# Patient Record
Sex: Female | Born: 1980 | Race: Black or African American | Hispanic: No | Marital: Single | Smoking: Never smoker
Health system: Southern US, Community
[De-identification: ages and names within clinical notes are randomized; demographics above are authoritative.]

## PROBLEM LIST (undated history)

## (undated) DIAGNOSIS — J302 Other seasonal allergic rhinitis: Secondary | ICD-10-CM

## (undated) DIAGNOSIS — G4730 Sleep apnea, unspecified: Secondary | ICD-10-CM

## (undated) DIAGNOSIS — N926 Irregular menstruation, unspecified: Secondary | ICD-10-CM

## (undated) DIAGNOSIS — R519 Headache, unspecified: Secondary | ICD-10-CM

## (undated) DIAGNOSIS — M1990 Unspecified osteoarthritis, unspecified site: Secondary | ICD-10-CM

## (undated) DIAGNOSIS — J45909 Unspecified asthma, uncomplicated: Secondary | ICD-10-CM

## (undated) DIAGNOSIS — G5600 Carpal tunnel syndrome, unspecified upper limb: Secondary | ICD-10-CM

## (undated) DIAGNOSIS — F419 Anxiety disorder, unspecified: Secondary | ICD-10-CM

## (undated) DIAGNOSIS — R51 Headache: Secondary | ICD-10-CM

## (undated) HISTORY — PX: WISDOM TOOTH EXTRACTION: SHX21

## (undated) HISTORY — DX: Headache, unspecified: R51.9

## (undated) HISTORY — DX: Headache: R51

---
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
## 2002-05-12 ENCOUNTER — Other Ambulatory Visit: Admission: RE | Admit: 2002-05-12 | Discharge: 2002-05-12 | Payer: Self-pay | Admitting: Gynecology

## 2003-05-24 ENCOUNTER — Other Ambulatory Visit: Admission: RE | Admit: 2003-05-24 | Discharge: 2003-05-24 | Payer: Self-pay | Admitting: Gynecology

## 2003-12-12 ENCOUNTER — Inpatient Hospital Stay (HOSPITAL_COMMUNITY): Admission: AD | Admit: 2003-12-12 | Discharge: 2003-12-15 | Payer: Self-pay | Admitting: *Deleted

## 2003-12-12 ENCOUNTER — Encounter (INDEPENDENT_AMBULATORY_CARE_PROVIDER_SITE_OTHER): Payer: Self-pay | Admitting: Specialist

## 2004-03-08 ENCOUNTER — Other Ambulatory Visit: Admission: RE | Admit: 2004-03-08 | Discharge: 2004-03-08 | Payer: Self-pay | Admitting: Gynecology

## 2004-10-01 ENCOUNTER — Emergency Department (HOSPITAL_COMMUNITY): Admission: EM | Admit: 2004-10-01 | Discharge: 2004-10-01 | Payer: Self-pay | Admitting: Family Medicine

## 2006-03-11 ENCOUNTER — Inpatient Hospital Stay (HOSPITAL_COMMUNITY): Admission: AD | Admit: 2006-03-11 | Discharge: 2006-03-11 | Payer: Self-pay | Admitting: Gynecology

## 2006-03-18 ENCOUNTER — Encounter (INDEPENDENT_AMBULATORY_CARE_PROVIDER_SITE_OTHER): Payer: Self-pay | Admitting: *Deleted

## 2006-03-18 ENCOUNTER — Ambulatory Visit: Payer: Self-pay | Admitting: Obstetrics & Gynecology

## 2006-03-25 ENCOUNTER — Ambulatory Visit: Payer: Self-pay | Admitting: Obstetrics & Gynecology

## 2006-04-01 ENCOUNTER — Ambulatory Visit: Payer: Self-pay | Admitting: Obstetrics & Gynecology

## 2006-04-08 ENCOUNTER — Ambulatory Visit: Payer: Self-pay | Admitting: Obstetrics & Gynecology

## 2006-04-15 ENCOUNTER — Ambulatory Visit: Payer: Self-pay | Admitting: Obstetrics & Gynecology

## 2006-04-22 ENCOUNTER — Ambulatory Visit: Payer: Self-pay | Admitting: Obstetrics & Gynecology

## 2006-04-29 ENCOUNTER — Ambulatory Visit: Payer: Self-pay | Admitting: *Deleted

## 2006-05-06 ENCOUNTER — Ambulatory Visit: Payer: Self-pay | Admitting: Obstetrics & Gynecology

## 2006-05-07 ENCOUNTER — Ambulatory Visit: Payer: Self-pay | Admitting: Family Medicine

## 2006-05-08 ENCOUNTER — Inpatient Hospital Stay (HOSPITAL_COMMUNITY): Admission: AD | Admit: 2006-05-08 | Discharge: 2006-05-10 | Payer: Self-pay | Admitting: Obstetrics & Gynecology

## 2006-05-08 ENCOUNTER — Ambulatory Visit: Payer: Self-pay | Admitting: Gynecology

## 2006-05-10 HISTORY — PX: TUBAL LIGATION: SHX77

## 2008-04-11 ENCOUNTER — Emergency Department (HOSPITAL_COMMUNITY): Admission: EM | Admit: 2008-04-11 | Discharge: 2008-04-11 | Payer: Self-pay | Admitting: Emergency Medicine

## 2008-04-13 ENCOUNTER — Inpatient Hospital Stay (HOSPITAL_COMMUNITY): Admission: AD | Admit: 2008-04-13 | Discharge: 2008-04-13 | Payer: Self-pay | Admitting: Obstetrics & Gynecology

## 2008-06-05 ENCOUNTER — Ambulatory Visit (HOSPITAL_COMMUNITY): Admission: RE | Admit: 2008-06-05 | Discharge: 2008-06-05 | Payer: Self-pay | Admitting: Obstetrics and Gynecology

## 2008-06-07 ENCOUNTER — Ambulatory Visit: Payer: Self-pay | Admitting: Obstetrics & Gynecology

## 2008-06-08 ENCOUNTER — Encounter: Payer: Self-pay | Admitting: Obstetrics & Gynecology

## 2008-06-08 LAB — CONVERTED CEMR LAB
Chlamydia, DNA Probe: NEGATIVE
TSH: 0.264 microintl units/mL — ABNORMAL LOW (ref 0.350–4.50)

## 2008-06-27 ENCOUNTER — Ambulatory Visit: Payer: Self-pay | Admitting: Obstetrics & Gynecology

## 2008-06-27 ENCOUNTER — Encounter: Payer: Self-pay | Admitting: Obstetrics & Gynecology

## 2008-06-27 LAB — CONVERTED CEMR LAB: T3 Uptake Ratio: 24.1 % (ref 22.5–37.0)

## 2008-06-29 ENCOUNTER — Ambulatory Visit: Payer: Self-pay | Admitting: Obstetrics & Gynecology

## 2008-06-29 ENCOUNTER — Encounter: Payer: Self-pay | Admitting: Obstetrics & Gynecology

## 2008-06-29 LAB — CONVERTED CEMR LAB
Chlamydia, DNA Probe: NEGATIVE
GC Probe Amp, Genital: NEGATIVE

## 2008-06-30 ENCOUNTER — Encounter: Payer: Self-pay | Admitting: Obstetrics & Gynecology

## 2008-06-30 LAB — CONVERTED CEMR LAB: Trich, Wet Prep: NONE SEEN

## 2009-06-28 ENCOUNTER — Emergency Department (HOSPITAL_COMMUNITY): Admission: EM | Admit: 2009-06-28 | Discharge: 2009-06-28 | Payer: Self-pay | Admitting: Family Medicine

## 2009-06-30 ENCOUNTER — Emergency Department (HOSPITAL_COMMUNITY): Admission: EM | Admit: 2009-06-30 | Discharge: 2009-06-30 | Payer: Self-pay | Admitting: Family Medicine

## 2009-11-14 ENCOUNTER — Ambulatory Visit: Payer: Self-pay | Admitting: Obstetrics and Gynecology

## 2009-11-14 LAB — CONVERTED CEMR LAB: Pap Smear: NEGATIVE

## 2010-02-20 ENCOUNTER — Ambulatory Visit: Payer: Self-pay | Admitting: Obstetrics and Gynecology

## 2010-02-21 ENCOUNTER — Encounter: Payer: Self-pay | Admitting: Obstetrics & Gynecology

## 2010-02-21 LAB — CONVERTED CEMR LAB
MCHC: 33.7 g/dL (ref 30.0–36.0)
Platelets: 390 10*3/uL (ref 150–400)
RDW: 14.1 % (ref 11.5–15.5)
TSH: 0.564 microintl units/mL (ref 0.350–4.500)

## 2010-07-14 ENCOUNTER — Encounter: Payer: Self-pay | Admitting: *Deleted

## 2010-11-05 NOTE — Group Therapy Note (Signed)
Tara Branch, Tara Branch              ACCOUNT NO.:  [id number]   MEDICAL RECORD NO.:  [id number]          PATIENT TYPE:  WOC   LOCATION:  [facility]                   FACILITY:  [facility]   PHYSICIAN:  Allie Bossier, MD        DATE OF BIRTH:  [birth year]   DATE OF SERVICE:  06/07/2008                                  CLINIC NOTE   CHIEF COMPLAINT:  Menorrhagia.   HISTORY OF PRESENT ILLNESS:  Tara Branch is a 30 year old G4, P2-0-2-2, who  presents for two episodes or irregular bleeding.  The patient is not  currently not using any oral hormonal contraceptive method.  She had her  tubes tied in 2007 after her last pregnancy.  She had regular periods  her entire life up until September of 2009.  She had periods every 28  days that were 5-6 days long with moderate blood flow and no cramping;  however, at the beginning of September 2009 she began to bleed heavily,  passing clots and feeling dizzy upon standing.  She continued to bleed  throughout September into October and she did present to the maternity  admissions unit at [facility] on April 13, 2008 for this  bleeding.  At that time a pelvic ultrasound was performed that showed a  normal endometrial stripe thickness and appearance.  No evidence of  fibroids.  A normal sized uterus measuring approximately 8 cm in length.  Endometrial stripe was 7 mm in maximum thickness.  Both ovaries were  normal in appearance with no masses or free fluid identified.  A CBC was  also obtained and hemoglobin was found to be 11.6.  The patient was  given a 5-day course of Provera and she reports that she stopped  bleeding on the fourth day of her Provera.  She did not bleed in  November.  Her last period started on May 24, 2008 and it lasted  nine days which is unusual for her.  She denies any pelvic pain, any  vaginal discharge.  She reports that she is not sexually active.   PHYSICAL EXAMINATION:  VITAL SIGNS:  Temperature is 97.6, pulse is 97,  blood  pressure 116/73, respiratory rate 20, weight is 214.1 pounds,  height is 5 feet 5 inches.  GENERAL:  The patient is a pleasant, obese female in no acute distress.  PELVIC:  The patient has normal external genitalia, normal vaginal  mucosa.  There is a yellow, thin, vaginal discharge present.  Cervix is  normal without any lesions and nontendcr.  Uterus is of normal size and  nontender.  Adnexa are nontender without any masses.   ASSESSMENT/PLAN:  1. This is a 30 year old female with dysfunctional uterine bleeding.  2. Vaginal discharge.  Cultures were collected for gonorrhea and      Chlamydia.  The patient was informed that she will be informed of      any abnormal results.\  3. Screening for cervical cancer.  The patient was due for a Pap smear      as her last Pap smear was in 2007 and this was performed and she  was notified that she would be informed of the results.   PLAN:  We will check a TSH and start a monophasic birth control pill.  A  prescription was written for Lo-Ovral.  She was instructed to come back  in three weeks to ensure that there are no adverse reactions to the  birth control pill including hypertension.      Allie Bossier, MD     MCD/MEDQ  D:  06/07/2008  T:  06/07/2008  Job:  [id number]

## 2010-11-05 NOTE — Group Therapy Note (Signed)
NAMEMCKENZIE, BOVE              ACCOUNT NO.:  [id number]   MEDICAL RECORD NO.:  [id number]          PATIENT TYPE:  WOC   LOCATION:  [facility]                   FACILITY:  [facility]   PHYSICIAN:  Johnella Moloney, MD        DATE OF BIRTH:  [birth year]   DATE OF SERVICE:  06/29/2008                                  CLINIC NOTE   CHIEF COMPLAINT:  Pain with intercourse.   HISTORY OF PRESENT ILLNESS:  The patient is a 30 year old gravida 4,  para 2-0-2-2 who was last seen for her annual examination in June 07, 2008.  The patient reports that 2 weeks ago, she had one episode of  pain during intercourse and also noted a small amount of spotting.  She  is also noted some abnormal discharge, but this went away after few  days.  The patient reports no other symptoms.   PHYSICAL EXAMINATION:  VITAL SIGNS:  The patient was afebrile.  Other  vital signs were stable.  ABDOMEN:  Soft, nontender, and nondistended.  PELVIC:  The patient had a normal external female genitalia.  On  speculum examination, the patient was noted to have a retained tampon.  The patient reports that her last menstrual period was over a month ago  and she did not realize that the tampon was still in place.  The tampon  was grasped with ring forceps and removed in its entirety.  There was  some vaginal irritation from the upper part of vagina where the tampon  was and abnormal discharge that was yellow in appearance and very  malodorous.  A sample of test was taken for wet prep and a gonorrhea and  Chlamydia were also done from her cervix.  On bimanual exam, the patient  had no cervical motion tenderness, uterine tenderness, or adnexal  tenderness.  No abnormal masses were palpated.   IMPRESSION:  The patient is a 27-year gravida 4, para 2 presenting with  dyspareunia and found to have a retained tampon on examination.  The  patient was told that a retained tampon that has led to some Trichomonas  vaginitis and possible  cervicitis, which could explain her dyspareunia.  She was informed about the dangers of retained tampon including toxic  shock syndrome and told to be more vigilant if she is going to continue  to use hot tampons for her menstrual periods.  Given the abnormal  discharge, she was given a prescription for Flagyl 500 mg p.o. b.i.d. as  the least that she would have given her to retain tablet will be  bacterial vaginosis.  We will followup the results of the wet prep and  GC Chlamydia to evaluate for further management.           ______________________________  Johnella Moloney, MD     UD/MEDQ  D:  06/29/2008  T:  06/30/2008  Job:  [id number]

## 2010-11-08 NOTE — Op Note (Signed)
Tara Branch, Tara Branch              ACCOUNT NO.:  [id number]   MEDICAL RECORD NO.:  [id number]          PATIENT TYPE:  WOC   LOCATION:  [facility]                          FACILITY:  [facility]   PHYSICIAN:  Phil D. Okey Dupre, M.D.     DATE OF BIRTH:  07/22/1980   DATE OF PROCEDURE:  05/10/2006  DATE OF DISCHARGE:                                 OPERATIVE REPORT   PREOPERATIVE DIAGNOSIS:  Multiparity, desires permanent sterilization.   POSTOPERATIVE DIAGNOSIS:  Multiparity, desires permanent sterilization.   PROCEDURE:  Postpartum bilateral tubal ligation with Filshie clips.   SURGEON:  Javier Glazier. Okey Dupre, M.D.   ASSISTANT:  Paticia Stack, MD.   ANESTHESIA:  General.   SPECIMENS:  None.   ESTIMATED BLOOD LOSS:  Minimal.   No complications.   INDICATIONS FOR PROCEDURE:  This is a 30 year old gravida 4, para 2-0-2-2,  status post spontaneous vaginal delivery who desires permanent  sterilization.  Risks and benefits of procedure discussed with the patient  including risk of failure and increased risk of ectopic gestation if  pregnancy occurs.   FINDINGS:  Normal uterus, tubes and ovaries.   PROCEDURE:  Patient was taken to the operating room where she was placed  under general anesthesia.  A small vertical infraumbilical skin incision was  made with a scalpel.  The incision was carried down through the underlying  fascia until the peritoneum was identified and entered.  The peritoneum was  noted to be free of any adhesions an the incision was then extended with the  Metzenbaum scissors.  The patient's left fallopian tube was then identified,  brought to the incision and grasped with the Babcock clamp.  The tube was  followed out to the fimbria.  Babcock clamps were used to isolate the tube  and Filshie clip was placed about 4 cm from the cornual region.  There was  no bleeding and the tube returned to the abdomen.  The right fallopian tube  was then grasped in a similar fashion with the Babcock  clamps and the  Filshie clips placed in a similar fashion.  No bleeding was noted and the  tube was  returned to the abdomen.  The peritoneum and fascia was then closed in a  single layer using #3-0 Vicryl.  The skin was closed in a subcuticular  fashion using #2-0 Vicryl on a Keith needle.  The patient tolerated the  procedure well.  Sponge, lap and needle counts were correct x2.  The patient  was taken to the recovery room in stable condition.     ______________________________  Paticia Stack, MD    ______________________________  Javier Glazier Okey Dupre, M.D.    LNJ/MEDQ  D:  05/10/2006  T:  05/10/2006  Job:  [id number]

## 2010-11-08 NOTE — Op Note (Signed)
[name], Tara Branch                          ACCOUNT NO.:  [id number]   MEDICAL RECORD NO.:  [id number]                   PATIENT TYPE:  INP   LOCATION:  9160                                 [facility]   PHYSICIAN:  Timothy P. Fontaine, M.D.           DATE OF BIRTH:  Nov 27, 1980   DATE OF PROCEDURE:  12/12/2003  DATE OF DISCHARGE:                                 OPERATIVE REPORT   PREOPERATIVE DIAGNOSES:  1. Pregnancy at term.  2. Nonreassuring fetal heart rate tracing, recurrent late decelerations.   POSTOPERATIVE DIAGNOSES:  1. Pregnancy at term.  2. Nonreassuring fetal heart rate tracing, recurrent late decelerations.   PROCEDURE:  Primary low transverse cervical cesarean section.   SURGEON:  Timothy P. Fontaine, M.D.   ASSISTANT:  Scrub technician.   ANESTHESIA:  Epidural with Marcaine 0.25% subcuticular injection.   ESTIMATED BLOOD LOSS:  Less than 500 mL.   COMPLICATIONS:  None.   SPECIMENS:  1. Samples of cord blood.  2. Placenta umbilical cord.   FINDINGS:  At 40 normal female infant, Apgar 8 & 9, weight 7 pounds 13  ounces, pelvic anatomy noted to be normal.   DESCRIPTION OF PROCEDURE:  The patient was taken to the operating room and  underwent dosing of her epidural catheter and was placed in left tilt supine  position, received an abdominal preparation with Betadine solution and was  draped in the usual fashion having had a Foley catheter previously placed in  Labor & Delivery.  Initial check for adequate anesthesia showed persistent  tenderness along the incision line and the incision line was subsequently  injected using 0.25% Marcaine in the subcutaneous space. Subsequently, the  abdomen was sharply entered through a primary Pfannenstiel incision  achieving adequate hemostasis at all levels. The bladder flap was sharply  and bluntly developed without difficulty and the uterus was sharply entered  in the lower uterine segment and bluntly extended  laterally.  The membranes  were ruptured. The fluid noted to be mildly green tinged, no particular  matter. The head was delivered, the nares and mouth suctioned, nuchal cord  x1 reduced, the rest of the infant delivered, the cord doubly clamped and  cut and the infant was handed to pediatrics in attendance. Samples of cord  blood were obtained, the placenta was then spontaneously extruded, noted to  be intact and was sent to pathology. The uterus was exteriorized and the  endometrial cavity was explored with a sponge to remove all placental and  membrane fragments.  The patient received 1 g Ancef antibiotic prophylaxis  at this time.  The uterine incision was closed in one layer using #0 Vicryl  suture in a running interlocking stitch. The uterus was returned to the  abdomen which was copiously irrigated and adequate hemostasis was  visualized. The anterior fascia was reapproximated using #0 Vicryl suture in  a running stitch. The subcutaneous tissue was irrigated, adequate hemostasis  was achieved with electrocautery, the skin reapproximated using 4-0 Vicryl  in a running subcuticular stitch, Steri-Strips and Benzoin applied, sterile  dressing applied. The patient was taken to the recovery room in good  condition having tolerated the procedure well.                                               Timothy P. Audie Box, M.D.    TPF/MEDQ  D:  12/12/2003  T:  12/13/2003  Job:  [id number]

## 2010-11-08 NOTE — Discharge Summary (Signed)
Tara Branch, Tara Branch                          ACCOUNT NO.:  [id number]   MEDICAL RECORD NO.:  [id number]                   PATIENT TYPE:  INP   LOCATION:  9102                                 [facility]   PHYSICIAN:  Timothy P. Fontaine, M.D.           DATE OF BIRTH:  1980/10/09   DATE OF ADMISSION:  12/12/2003  DATE OF DISCHARGE:  12/15/2003                                 DISCHARGE SUMMARY   DISCHARGE DIAGNOSES:  1. Pregnancy at term.  2. Nonreassuring fetal tracing.   PROCEDURE:  Primary low transverse cervical cesarean section on December 12, 2003.   HOSPITAL COURSE:  A 30 year old G3 P0 AB2 female at [redacted] weeks gestation  admitted in labor.  The patient progressed to 4 cm dilatation at which time  she had repetitive late decelerations and underwent a primary low transverse  cervical cesarean section for nonreassuring fetal tracing.  The patient  produced a normal female infant, Apgars 8 and 9, weight 7 pounds 13 ounces, at  1402.  The patient's postpartum course was uncomplicated.  She was  discharged on postpartum day #3 ambulating well, tolerating a regular diet,  with a hemoglobin of 10, a maternal blood type of O positive, and a rubella  titer positive.  The patient received precautions, instructions, and follow-  up, will be seen in the office in 6 weeks, and received a prescription for  Tylox #20 one to two p.o. q.4-6h. p.r.n. pain.                                               Timothy P. Audie Box, M.D.    TPF/MEDQ  D:  12/15/2003  T:  12/16/2003  Job:  [id number]

## 2011-01-20 ENCOUNTER — Encounter (HOSPITAL_COMMUNITY): Payer: Self-pay | Admitting: *Deleted

## 2011-01-20 ENCOUNTER — Inpatient Hospital Stay (HOSPITAL_COMMUNITY)
Admission: AD | Admit: 2011-01-20 | Discharge: 2011-01-20 | Disposition: A | Discharge status: Home / Self Care | Payer: Self-pay | Source: Ambulatory Visit | Attending: Obstetrics and Gynecology | Admitting: Obstetrics and Gynecology

## 2011-01-20 DIAGNOSIS — B373 Candidiasis of vulva and vagina: Secondary | ICD-10-CM

## 2011-01-20 DIAGNOSIS — B3731 Acute candidiasis of vulva and vagina: Secondary | ICD-10-CM | POA: Insufficient documentation

## 2011-01-20 DIAGNOSIS — N939 Abnormal uterine and vaginal bleeding, unspecified: Secondary | ICD-10-CM

## 2011-01-20 DIAGNOSIS — R109 Unspecified abdominal pain: Secondary | ICD-10-CM | POA: Insufficient documentation

## 2011-01-20 DIAGNOSIS — N898 Other specified noninflammatory disorders of vagina: Secondary | ICD-10-CM | POA: Insufficient documentation

## 2011-01-20 LAB — CBC
HCT: 35.7 % — ABNORMAL LOW (ref 36.0–46.0)
MCH: 30.4 pg (ref 26.0–34.0)
MCV: 89 fL (ref 78.0–100.0)
RBC: 4.01 MIL/uL (ref 3.87–5.11)
WBC: 11.4 10*3/uL — ABNORMAL HIGH (ref 4.0–10.5)

## 2011-01-20 LAB — WET PREP, GENITAL

## 2011-01-20 LAB — POCT PREGNANCY, URINE: Preg Test, Ur: NEGATIVE

## 2011-01-20 MED ORDER — FLUCONAZOLE 150 MG PO TABS: 150.0000 mg | ORAL_TABLET | Freq: Once | ORAL | Status: AC

## 2011-01-20 MED ORDER — MEDROXYPROGESTERONE ACETATE 5 MG PO TABS: 10.0000 mg | ORAL_TABLET | Freq: Every day | ORAL | Status: DC

## 2011-01-20 NOTE — Progress Notes (Signed)
Pt reports she had a period in January and did not bleed again until 06/03 and has been bleeding since that time. Bleeding is heavy at times and just spotting at times. Has been seen for same here in the past. Has had irregular periods since BTL in 2007. Off/on cramping

## 2011-01-20 NOTE — Progress Notes (Signed)
Tara Branch in with pt.

## 2011-01-20 NOTE — Progress Notes (Signed)
Pt. Had a period in January that lasted all month.  She didn't have another period until June.  She has been bleeding since the beginning of June.  Pt. States that it is heavy for a few days and then it slows down, then it gets heavy again.

## 2011-01-20 NOTE — ED Provider Notes (Signed)
History    Tara Branch 30 year old black female. She is gravida 4 para 2 AB 2. She does today complaining of irregular menses since January. She states she bled the entire month of January and then did not have another period until June. Since that time she's been bleeding off and on. She complains of mild lower abdominal cramping. She denies fever or vaginal discharge or any other problems at this time. She's currently on oral contraceptives. She is uncertain which type. She states she's had her birth control change 3 different times.  Chief Complaint  Tara Branch presents with  . Vaginal Bleeding   HPI  OB History    Grav Para Term Preterm Abortions TAB SAB Ect Mult Living   4 2 2  2     2       Past Medical History  Diagnosis Date  . No pertinent past medical history     Past Surgical History  Procedure Date  . Cesarean section   . Tubal ligation     No family history on file.  History  Substance Use Topics  . Smoking status: Not on file  . Smokeless tobacco: Not on file  . Alcohol Use:     Allergies: No Known Allergies  Prescriptions prior to admission  Medication Sig Dispense Refill  . Naproxen Sodium (ALEVE) 220 MG CAPS Take 3 capsules by mouth daily as needed. Tara Branch took this medication for pain.       Marland Kitchen norethindrone-ethinyl estradiol (OVCON-35,BALZIVA,BRIELLYN) 0.4-35 MG-MCG tablet Take 1 tablet by mouth daily.          Review of Systems  Constitutional: Negative for fever and chills.  Gastrointestinal: Positive for abdominal pain. Negative for nausea, vomiting, diarrhea and constipation.  Genitourinary: Negative for dysuria, urgency, frequency, hematuria and flank pain.  Neurological: Negative for dizziness and headaches.  Psychiatric/Behavioral: Negative for depression and suicidal ideas.   Physical Exam   Blood pressure 132/75, pulse 90, temperature 98.1 F (36.7 C), temperature source Oral, resp. rate 16, height 5\' 5"  (1.651 m), weight 238 lb (107.956 kg),  last menstrual period 11/24/2010.  Physical Exam  Constitutional: She is oriented to person, place, and time. She appears well-developed and well-nourished. No distress.  HENT:  Head: Normocephalic and atraumatic.  GI: Soft. She exhibits no distension and no mass. There is no tenderness. There is no rebound and no guarding.  Genitourinary: There is bleeding around the vagina. No tenderness around the vagina. No foreign body around the vagina. No vaginal discharge found.        Uterus is normal size and shape. There no adnexal masses. Tara Branch is nontender on exam.  Neurological: She is alert and oriented to person, place, and time.  Skin: Skin is warm and dry. She is not diaphoretic.  Psychiatric: She has a normal mood and affect. Her behavior is normal. Judgment and thought content normal.    MAU Course  Procedures  GC and Chlamydia cultures were obtained. Wet prep was obtained.  Results for orders placed during the hospital encounter of 01/20/11 (from the past 24 hour(s))  CBC     Status: Abnormal   Collection Time   01/20/11  8:03 PM      Component Value Range   WBC 11.4 (*) 4.0 - 10.5 (K/uL)   RBC 4.01  3.87 - 5.11 (MIL/uL)   Hemoglobin 12.2  12.0 - 15.0 (g/dL)   HCT 86.5 (*) 78.4 - 46.0 (%)   MCV 89.0  78.0 - 100.0 (fL)  MCH 30.4  26.0 - 34.0 (pg)   MCHC 34.2  30.0 - 36.0 (g/dL)   RDW 16.1  09.6 - 04.5 (%)   Platelets 325  150 - 400 (K/uL)  POCT PREGNANCY, URINE     Status: Normal   Collection Time   01/20/11  9:23 PM      Component Value Range   Preg Test, Ur NEGATIVE    WET PREP, GENITAL     Status: Abnormal   Collection Time   01/20/11  9:50 PM      Component Value Range   Yeast, Wet Prep FEW (*) NONE SEEN    Trich, Wet Prep NONE SEEN  NONE SEEN    Clue Cells, Wet Prep FEW (*) NONE SEEN    WBC, Wet Prep HPF POC FEW (*) NONE SEEN     Assessment and Plan  Abnormal vaginal bleeding: I did discuss this with Tara Branch at length. I will give her prescription for Provera  10 mg 1 daily for 10 days. She has an appointment to followup in the GYN clinic.  Yeast: We'll give the Tara Branch a prescription for Diflucan 150 mg to a single dose. She'll followup in the GYN clinic.  Clinton Gallant. Daysean Tinkham III, DrHSc, MPAS, PA-C  01/20/2011, 9:47 PM   Henrietta Hoover, PA 01/20/11 2211

## 2011-01-21 LAB — GC/CHLAMYDIA PROBE AMP, GENITAL
Chlamydia, DNA Probe: NEGATIVE
GC Probe Amp, Genital: NEGATIVE

## 2011-01-21 NOTE — ED Provider Notes (Signed)
Agree with above note.  Marlin Brys 01/21/2011 7:03 AM

## 2011-02-20 ENCOUNTER — Ambulatory Visit: Payer: Self-pay | Admitting: Obstetrics and Gynecology

## 2011-03-24 LAB — POCT URINALYSIS DIP (DEVICE)
Bilirubin Urine: NEGATIVE
Glucose, UA: NEGATIVE
Ketones, ur: NEGATIVE
Operator id: 29721
Protein, ur: 100 — AB

## 2011-03-24 LAB — POCT PREGNANCY, URINE: Preg Test, Ur: NEGATIVE

## 2011-03-24 LAB — WET PREP, GENITAL: Yeast Wet Prep HPF POC: NONE SEEN

## 2011-03-24 LAB — POCT I-STAT, CHEM 8
BUN: 11
Chloride: 105
Potassium: 3.8
Sodium: 139
TCO2: 26

## 2012-04-30 ENCOUNTER — Encounter: Payer: Self-pay | Admitting: Advanced Practice Midwife

## 2012-04-30 ENCOUNTER — Ambulatory Visit (INDEPENDENT_AMBULATORY_CARE_PROVIDER_SITE_OTHER): Payer: Self-pay | Admitting: Advanced Practice Midwife

## 2012-04-30 ENCOUNTER — Other Ambulatory Visit: Payer: Self-pay | Admitting: Advanced Practice Midwife

## 2012-04-30 VITALS — BP 136/82 | HR 83 | Temp 97.1°F | Ht 64.0 in | Wt 236.2 lb

## 2012-04-30 DIAGNOSIS — N949 Unspecified condition associated with female genital organs and menstrual cycle: Secondary | ICD-10-CM

## 2012-04-30 DIAGNOSIS — Z01419 Encounter for gynecological examination (general) (routine) without abnormal findings: Secondary | ICD-10-CM

## 2012-04-30 DIAGNOSIS — N938 Other specified abnormal uterine and vaginal bleeding: Secondary | ICD-10-CM

## 2012-04-30 DIAGNOSIS — E282 Polycystic ovarian syndrome: Secondary | ICD-10-CM

## 2012-04-30 LAB — CBC
HCT: 37.8 % (ref 36.0–46.0)
RBC: 4.31 MIL/uL (ref 3.87–5.11)
RDW: 13.7 % (ref 11.5–15.5)
WBC: 6.9 10*3/uL (ref 4.0–10.5)

## 2012-04-30 LAB — TSH: TSH: 0.894 u[IU]/mL (ref 0.350–4.500)

## 2012-04-30 MED ORDER — MEDROXYPROGESTERONE ACETATE 10 MG PO TABS: 10.0000 mg | ORAL_TABLET | Freq: Every day | ORAL | Status: DC

## 2012-04-30 NOTE — Patient Instructions (Addendum)
Polycystic Ovarian Syndrome Polycystic ovarian syndrome is a condition with a number of problems. One problem is with the ovaries. The ovaries are organs located in the female pelvis, on each side of the uterus. Usually, during the menstrual cycle, an egg is released from 1 ovary every month. This is called ovulation. When the egg is fertilized, it goes into the womb (uterus), which allows for the growth of a baby. The egg travels from the ovary through the fallopian tube to the uterus. The ovaries also make the hormones estrogen and progesterone. These hormones help the development of a woman's breasts, body shape, and body hair. They also regulate the menstrual cycle and pregnancy. Sometimes, cysts form in the ovaries. A cyst is a fluid-filled sac. On the ovary, different types of cysts can form. The most common type of ovarian cyst is called a functional or ovulation cyst. It is normal, and often forms during the normal menstrual cycle. Each month, a woman's ovaries grow tiny cysts that hold the eggs. When an egg is fully grown, the sac breaks open. This releases the egg. Then, the sac which released the egg from the ovary dissolves. In one type of functional cyst, called a follicle cyst, the sac does not break open to release the egg. It may actually continue to grow. This type of cyst usually disappears within 1 to 3 months.  One type of cyst problem with the ovaries is called Polycystic Ovarian Syndrome (PCOS). In this condition, many follicle cysts form, but do not rupture and produce an egg. This health problem can affect the following:  Menstrual cycle.  Heart.  Obesity.  Cancer of the uterus.  Fertility.  Blood vessels.  Hair growth (face and body) or baldness.  Hormones.  Appearance.  High blood pressure.  Stroke.  Insulin production.  Inflammation of the liver.  Elevated blood cholesterol and triglycerides. CAUSES   No one knows the exact cause of PCOS.  Women with  PCOS often have a mother or sister with PCOS. There is not yet enough proof to say this is inherited.  Many women with PCOS have a weight problem.  Researchers are looking at the relationship between PCOS and the body's ability to make insulin. Insulin is a hormone that regulates the change of sugar, starches, and other food into energy for the body's use, or for storage. Some women with PCOS make too much insulin. It is possible that the ovaries react by making too many female hormones, called androgens. This can lead to acne, excessive hair growth, weight gain, and ovulation problems.  Too much production of luteinizing hormone (LH) from the pituitary gland in the brain stimulates the ovary to produce too much female hormone (androgen). SYMPTOMS   Infrequent or no menstrual periods, and/or irregular bleeding.  Inability to get pregnant (infertility), because of not ovulating.  Increased growth of hair on the face, chest, stomach, back, thumbs, thighs, or toes.  Acne, oily skin, or dandruff.  Pelvic pain.  Weight gain or obesity, usually carrying extra weight around the waist.  Type 2 diabetes (this is the diabetes that usually does not need insulin).  High cholesterol.  High blood pressure.  Female-pattern baldness or thinning hair.  Patches of thickened and dark brown or black skin on the neck, arms, breasts, or thighs.  Skin tags, or tiny excess flaps of skin, in the armpits or neck area.  Sleep apnea (excessive snoring and breathing stops at times while asleep).  Deepening of the voice.    Gestational diabetes when pregnant.  Increased risk of miscarriage with pregnancy. DIAGNOSIS  There is no single test to diagnose PCOS.   Your caregiver will:  Take a medical history.  Perform a pelvic exam.  Perform an ultrasound.  Check your female and female hormone levels.  Measure glucose or sugar levels in the blood.  Do other blood tests.  If you are producing too many  female hormones, your caregiver will make sure it is from PCOS. At the physical exam, your caregiver will want to evaluate the areas of increased hair growth. Try to allow natural hair growth for a few days before the visit.  During a pelvic exam, the ovaries may be enlarged or swollen by the increased number of small cysts. This can be seen more easily by vaginal ultrasound or screening, to examine the ovaries and lining of the uterus (endometrium) for cysts. The uterine lining may become thicker, if there has not been a regular period. TREATMENT  Because there is no cure for PCOS, it needs to be managed to prevent problems. Treatments are based on your symptoms. Treatment is also based on whether you want to have a baby or whether you need contraception.  Treatment may include:  Progesterone hormone, to start a menstrual period.  Birth control pills, to make you have regular menstrual periods.  Medicines to make you ovulate, if you want to get pregnant.  Medicines to control your insulin.  Medicine to control your blood pressure.  Medicine and diet, to control your high cholesterol and triglycerides in your blood.  Surgery, making small holes in the ovary, to decrease the amount of female hormone production. This is done through a long, lighted tube (laparoscope), placed into the pelvis through a tiny incision in the lower abdomen. Your caregiver will go over some of the choices with you. WOMEN WITH PCOS HAVE THESE CHARACTERISTICS:  High levels of female hormones called androgens.  An irregular or no menstrual cycle.  May have many small cysts in their ovaries. PCOS is the most common hormonal reproductive problem in women of childbearing age. WHY DO WOMEN WITH PCOS HAVE TROUBLE WITH THEIR MENSTRUAL CYCLE? Each month, about 20 eggs start to mature in the ovaries. As one egg grows and matures, the follicle breaks open to release the egg, so it can travel through the fallopian tube for  fertilization. When the single egg leaves the follicle, ovulation takes place. In women with PCOS, the ovary does not make all of the hormones it needs for any of the eggs to fully mature. They may start to grow and accumulate fluid, but no one egg becomes large enough. Instead, some may remain as cysts. Since no egg matures or is released, ovulation does not occur and the hormone progesterone is not made. Without progesterone, a woman's menstrual cycle is irregular or absent. Also, the cysts produce female hormones, which continue to prevent ovulation.  Document Released: 10/03/2004 Document Revised: 09/01/2011 Document Reviewed: 04/27/2009 [employer] Patient Information 2013 [employer].  

## 2012-04-30 NOTE — Progress Notes (Addendum)
Patient ID: Tara Branch, female   DOB: 1981/05/22, 31 y.o.   MRN: [id number]  SUBJECTIVE Tara Branch is a 31 y.o. [phone] female who presents today for evaluation of irregular periods. Tara Branch reports menarche at age 29 and regular periods until she was pregnant in 2007. After delivery in 2007, Tara Branch had a bilateral tubal ligation. She reports 2 regular periods after the BTL, then amenorrhea for several months. Since 2007, Tara Branch has had only one period per year. She states that she will be amenorrheic for approximately 9 months, then will have irregular bleeding (mixture of spotting, heavy bleeding, and occasional clots) for approximately 3 months or until she goes to the ED to be evaluated. She has been put on oral contraceptive pills for this, which controlled it while she took them. Since she does not take the OCPs anymore, she has had this oligorrhea. These bouts of irregular menses are associated with back and lower abdominal pain that is partially alleviated by Ibuprofen or Midol. She also reports fatigue and abdominal bloating. She describes "hot flashes" since before the onset of these irregular periods. She reports occasional constipation, occasional blurred vision, difficulty losing weight even when she regularly exercises, and hair growth on her face, arms, and abdomen that has been present her entire life. She denies hair loss, dry or itchy skin, acne, dizziness, lightheadedness, chest pain, shortness of breath, dysuria, urinary frequency or urgency.  Past Medical History  Diagnosis Date   No pertinent past medical history     Past Surgical History  Procedure Date   Cesarean section    Tubal ligation     OBJECTIVE General: Alert, cooperative, no acute distress, oriented x 3 Head: Normocephalic, atraumatic Neck: Supple, no cervical lymphadenopathy; thyroid palpable, no masses Abdomen: Soft, nontender to light palpation; mild tenderness to deep palpation in left  lower quadrant; no rebound, guarding, or masses Genitourinary: Labia without lesion or rash; vagina with small amount of white odorless discharge; small drop of blood present in posterior vaginal vault along with white discharge; cervix not easily friable; no cervical motion tenderness; no adnexal masses, tenderness, or fullness  Filed Vitals:   04/30/12 1014  BP: 136/82  Pulse: 83  Temp: 97.1 F (36.2 C)    No results found for this or any previous visit (from the past 24 hour(s)).   ASSESSMENT Dysfunctional uterine bleeding Probable PCOS  PLAN -  TSH, CBC, and PAP performed today -  Pt will return for glucose tolerance test -  Pt will be referred to primary care provider for full workup of her symptoms and possible PCOS -  Discussed treatment options with pt. Pt is interested in Progesterone pill every month to stimulate shedding of uterine lining. Prescribed today.  Seen by me also and agree Offered combination OCPs vs Provera "clean out" every other month, and pt elects Provera Suspect she has PCOS due to hirsutism and abdominal fat distribution Will get 2 hr GTT since I am not sure she will go to a primary doctor any time soon. If she tests positive, I will insist more strongly

## 2012-05-01 ENCOUNTER — Encounter: Payer: Self-pay | Admitting: Advanced Practice Midwife

## 2012-05-01 DIAGNOSIS — E282 Polycystic ovarian syndrome: Secondary | ICD-10-CM | POA: Insufficient documentation

## 2012-05-01 DIAGNOSIS — N938 Other specified abnormal uterine and vaginal bleeding: Secondary | ICD-10-CM | POA: Insufficient documentation

## 2012-05-07 ENCOUNTER — Ambulatory Visit (HOSPITAL_COMMUNITY): Payer: Self-pay

## 2012-05-08 MED ORDER — METRONIDAZOLE 500 MG PO TABS: 2000.0000 mg | ORAL_TABLET | Freq: Once | ORAL | Status: DC

## 2012-05-08 NOTE — Addendum Note (Signed)
Addended by: Aviva Signs on: 05/08/2012 12:35 AM   Modules accepted: Orders

## 2012-05-10 ENCOUNTER — Telehealth: Payer: Self-pay | Admitting: Medical

## 2012-05-10 NOTE — Telephone Encounter (Signed)
Message copied by Freddi Starr on Mon May 10, 2012  9:24 AM ------      Message from: Aviva Signs      Created: Sat May 08, 2012 12:33 AM      Regarding: pap with Ivery Quale on it       Did a pap, but she had no complaints of discharge.            The pap was normal but showed trich.            Can we call her and tell her to pick up med?            thanks

## 2012-05-10 NOTE — Telephone Encounter (Signed)
LM for patient to return call to clinic. Needs to be informed of Trich dx and Rx at pharmacy.

## 2012-05-11 NOTE — Telephone Encounter (Signed)
LM for patient to return call to clinic. Needs to be informed of trich dx and rx at pharmacy.

## 2012-05-12 NOTE — Telephone Encounter (Signed)
Called pt @ home # and informed her of test results as well as treatment needed.  Pt was also advised that her partner(s) require treatment and she should not engage in intercourse until 1 week after all treatment is completed (hers and her partner's).  Pt voiced understanding.

## 2012-05-18 ENCOUNTER — Telehealth: Payer: Self-pay | Admitting: *Deleted

## 2012-05-18 NOTE — Telephone Encounter (Addendum)
Pt left message stating that she was seen a couple weeks ago. She is having some problems and wants to speak to a nurse. I called pt and discussed her concerns.  She states that at the time of her last visit on 11/8, she was having irregular bleeding. She was placed on Provera and started the med but her bleeding actually increased while taking it. She stopped after 11 days (was only supposed to take for 10) and now her bleeding is very heavy, including clots which are the size of golf ball or smaller. I noticed that pt [facility] for [place] on 11/15 and asked her about this. She stated that she does not have insurance and could not afford the amount she was told she would have to pay. She will have insurance beginning 06/23/12. She has f/u appt on 12/6 but wants to know what to do about the bleeding now. I told pt that I will call her back after consulting with provider on call.  [id number]- called pt back after consulting with Dr. Debroah Loop.  I advised her that because she had not had a period for 9 months prior to taking the Provera, it is considered normal for this cycle to be heavy. She was advised to take ibuprofen 600mg  every 6hrs with food for the next 2-3 days. This should help with the abdominal cramping as well as the amount of bleeding. She was also advised to resume the regimen of Provera 10mg  daily x10 days in January, then every other month. She will most likely not have such a heavy period in the future. She should call our office if her future periods last more that 7 days and are very heavy. We also discussed her next clinic appt on 12/6. I recommended that pt wait to have follow up appt after 06/23/12 so that she can have her US performed and then the doctor will have more information to discuss. Also, she may have had another menstrual cycle by then and can discuss w/MD as well.  Pt agreed with this suggestion and she will call back to schedule [place] appt and Gyn follow up appt.  Pt voiced understanding of all  instructions and information given.

## 2012-05-28 ENCOUNTER — Ambulatory Visit: Payer: Self-pay | Admitting: Obstetrics & Gynecology

## 2012-06-03 ENCOUNTER — Encounter: Payer: Self-pay | Admitting: *Deleted

## 2012-06-03 NOTE — Telephone Encounter (Signed)
Erroneous telephone encounter.

## 2013-06-21 ENCOUNTER — Other Ambulatory Visit: Payer: Self-pay | Admitting: Internal Medicine

## 2013-06-28 ENCOUNTER — Other Ambulatory Visit: Payer: Self-pay | Admitting: Orthopedic Surgery

## 2013-07-01 ENCOUNTER — Encounter (HOSPITAL_BASED_OUTPATIENT_CLINIC_OR_DEPARTMENT_OTHER): Payer: Self-pay | Admitting: *Deleted

## 2013-07-04 ENCOUNTER — Other Ambulatory Visit: Payer: Self-pay | Admitting: Internal Medicine

## 2013-07-04 ENCOUNTER — Ambulatory Visit
Admission: RE | Admit: 2013-07-04 | Discharge: 2013-07-04 | Disposition: A | Discharge status: Home / Self Care | Payer: Medicaid Other | Source: Ambulatory Visit | Attending: Internal Medicine | Admitting: Internal Medicine

## 2013-07-04 DIAGNOSIS — N6310 Unspecified lump in the right breast, unspecified quadrant: Secondary | ICD-10-CM

## 2013-07-04 DIAGNOSIS — N6315 Unspecified lump in the right breast, overlapping quadrants: Secondary | ICD-10-CM

## 2013-07-04 DIAGNOSIS — N6325 Unspecified lump in the left breast, overlapping quadrants: Secondary | ICD-10-CM

## 2013-07-04 DIAGNOSIS — N6320 Unspecified lump in the left breast, unspecified quadrant: Principal | ICD-10-CM

## 2013-07-11 ENCOUNTER — Ambulatory Visit (HOSPITAL_BASED_OUTPATIENT_CLINIC_OR_DEPARTMENT_OTHER): Payer: Medicaid Other | Admitting: Anesthesiology

## 2013-07-11 ENCOUNTER — Encounter (HOSPITAL_BASED_OUTPATIENT_CLINIC_OR_DEPARTMENT_OTHER)
Admission: RE | Disposition: A | Discharge status: Home / Self Care | Payer: Self-pay | Source: Ambulatory Visit | Attending: Orthopedic Surgery

## 2013-07-11 ENCOUNTER — Encounter (HOSPITAL_BASED_OUTPATIENT_CLINIC_OR_DEPARTMENT_OTHER): Payer: Medicaid Other | Admitting: Anesthesiology

## 2013-07-11 ENCOUNTER — Encounter (HOSPITAL_BASED_OUTPATIENT_CLINIC_OR_DEPARTMENT_OTHER): Payer: Self-pay | Admitting: *Deleted

## 2013-07-11 ENCOUNTER — Ambulatory Visit (HOSPITAL_BASED_OUTPATIENT_CLINIC_OR_DEPARTMENT_OTHER)
Admission: RE | Admit: 2013-07-11 | Discharge: 2013-07-11 | Disposition: A | Discharge status: Home / Self Care | Payer: Medicaid Other | Source: Ambulatory Visit | Attending: Orthopedic Surgery | Admitting: Orthopedic Surgery

## 2013-07-11 DIAGNOSIS — G5600 Carpal tunnel syndrome, unspecified upper limb: Secondary | ICD-10-CM | POA: Insufficient documentation

## 2013-07-11 HISTORY — PX: CARPAL TUNNEL RELEASE: SHX101

## 2013-07-11 HISTORY — DX: Carpal tunnel syndrome, unspecified upper limb: G56.00

## 2013-07-11 LAB — POCT HEMOGLOBIN-HEMACUE: Hemoglobin: 12.4 g/dL (ref 12.0–15.0)

## 2013-07-11 SURGERY — CARPAL TUNNEL RELEASE
Anesthesia: General | Site: Hand | Laterality: Right

## 2013-07-11 MED ORDER — OXYCODONE HCL 5 MG/5ML PO SOLN: 5.0000 mg | Freq: Once | ORAL | Status: AC | PRN

## 2013-07-11 MED ORDER — PROPOFOL 10 MG/ML IV BOLUS
INTRAVENOUS | Status: DC | PRN
  Administered 2013-07-11: 200 mg via INTRAVENOUS

## 2013-07-11 MED ORDER — LIDOCAINE HCL (CARDIAC) 20 MG/ML IV SOLN
INTRAVENOUS | Status: DC | PRN
  Administered 2013-07-11: 60 mg via INTRAVENOUS

## 2013-07-11 MED ORDER — CHLORHEXIDINE GLUCONATE 4 % EX LIQD: 60.0000 mL | Freq: Once | CUTANEOUS | Status: DC

## 2013-07-11 MED ORDER — MIDAZOLAM HCL 2 MG/2ML IJ SOLN
INTRAMUSCULAR | Status: AC
  Filled 2013-07-11: qty 2

## 2013-07-11 MED ORDER — HYDROMORPHONE HCL PF 1 MG/ML IJ SOLN
0.2500 mg | INTRAMUSCULAR | Status: DC | PRN
  Administered 2013-07-11 (×2): 0.5 mg via INTRAVENOUS

## 2013-07-11 MED ORDER — FENTANYL CITRATE 0.05 MG/ML IJ SOLN
INTRAMUSCULAR | Status: AC
  Filled 2013-07-11: qty 6

## 2013-07-11 MED ORDER — ONDANSETRON HCL 4 MG/2ML IJ SOLN
INTRAMUSCULAR | Status: DC | PRN
  Administered 2013-07-11: 4 mg via INTRAVENOUS

## 2013-07-11 MED ORDER — MIDAZOLAM HCL 5 MG/5ML IJ SOLN
INTRAMUSCULAR | Status: DC | PRN
Start: 2013-07-11 — End: 2013-07-11
  Administered 2013-07-11: 2 mg via INTRAVENOUS

## 2013-07-11 MED ORDER — DEXAMETHASONE SODIUM PHOSPHATE 10 MG/ML IJ SOLN
INTRAMUSCULAR | Status: DC | PRN
  Administered 2013-07-11: 10 mg via INTRAVENOUS

## 2013-07-11 MED ORDER — HYDROMORPHONE HCL PF 1 MG/ML IJ SOLN
INTRAMUSCULAR | Status: AC
  Filled 2013-07-11: qty 1

## 2013-07-11 MED ORDER — FENTANYL CITRATE 0.05 MG/ML IJ SOLN: 50.0000 ug | INTRAMUSCULAR | Status: DC | PRN

## 2013-07-11 MED ORDER — CEFAZOLIN SODIUM-DEXTROSE 2-3 GM-% IV SOLR
2.0000 g | INTRAVENOUS | Status: DC
Start: 2013-07-11 — End: 2013-07-11

## 2013-07-11 MED ORDER — LACTATED RINGERS IV SOLN
INTRAVENOUS | Status: DC
  Administered 2013-07-11: 11:00:00 via INTRAVENOUS

## 2013-07-11 MED ORDER — CEFAZOLIN SODIUM-DEXTROSE 2-3 GM-% IV SOLR
INTRAVENOUS | Status: AC
  Filled 2013-07-11: qty 50

## 2013-07-11 MED ORDER — MIDAZOLAM HCL 2 MG/2ML IJ SOLN: 1.0000 mg | INTRAMUSCULAR | Status: DC | PRN

## 2013-07-11 MED ORDER — HYDROCODONE-ACETAMINOPHEN 5-325 MG PO TABS: ORAL_TABLET | ORAL | Status: DC

## 2013-07-11 MED ORDER — OXYCODONE HCL 5 MG PO TABS
5.0000 mg | ORAL_TABLET | Freq: Once | ORAL | Status: AC | PRN
  Administered 2013-07-11: 5 mg via ORAL
  Filled 2013-07-11: qty 1

## 2013-07-11 MED ORDER — FENTANYL CITRATE 0.05 MG/ML IJ SOLN
INTRAMUSCULAR | Status: DC | PRN
  Administered 2013-07-11 (×2): 25 ug via INTRAVENOUS
  Administered 2013-07-11: 100 ug via INTRAVENOUS
  Administered 2013-07-11: 25 ug via INTRAVENOUS

## 2013-07-11 MED ORDER — BUPIVACAINE HCL (PF) 0.25 % IJ SOLN
INTRAMUSCULAR | Status: DC | PRN
  Administered 2013-07-11: 9 mL

## 2013-07-11 MED ORDER — ONDANSETRON HCL 4 MG/2ML IJ SOLN
4.0000 mg | Freq: Once | INTRAMUSCULAR | Status: DC | PRN
Start: 2013-07-11 — End: 2013-07-11

## 2013-07-11 SURGICAL SUPPLY — 39 items
BANDAGE ELASTIC 3 VELCRO ST LF (GAUZE/BANDAGES/DRESSINGS) ×3 IMPLANT
BLADE MINI RND TIP GREEN BEAV (BLADE) IMPLANT
BLADE SURG 15 STRL LF DISP TIS (BLADE) ×2 IMPLANT
BLADE SURG 15 STRL SS (BLADE) ×6
BNDG CMPR 9X4 STRL LF SNTH (GAUZE/BANDAGES/DRESSINGS) ×1
BNDG ESMARK 4X9 LF (GAUZE/BANDAGES/DRESSINGS) ×2 IMPLANT
BNDG GAUZE ELAST 4 BULKY (GAUZE/BANDAGES/DRESSINGS) ×3 IMPLANT
CHLORAPREP W/TINT 26ML (MISCELLANEOUS) ×3 IMPLANT
CORDS BIPOLAR (ELECTRODE) ×3 IMPLANT
COVER MAYO STAND STRL (DRAPES) ×3 IMPLANT
COVER TABLE BACK 60X90 (DRAPES) ×3 IMPLANT
CUFF TOURNIQUET SINGLE 18IN (TOURNIQUET CUFF) ×3 IMPLANT
DRAPE EXTREMITY T 121X128X90 (DRAPE) ×3 IMPLANT
DRAPE SURG 17X23 STRL (DRAPES) ×3 IMPLANT
GAUZE XEROFORM 1X8 LF (GAUZE/BANDAGES/DRESSINGS) ×3 IMPLANT
GLOVE BIO SURGEON STRL SZ 6.5 (GLOVE) ×2 IMPLANT
GLOVE BIO SURGEON STRL SZ7.5 (GLOVE) ×3 IMPLANT
GLOVE BIO SURGEONS STRL SZ 6.5 (GLOVE) ×2
GLOVE BIOGEL PI IND STRL 7.0 (GLOVE) IMPLANT
GLOVE BIOGEL PI IND STRL 8 (GLOVE) ×1 IMPLANT
GLOVE BIOGEL PI INDICATOR 7.0 (GLOVE) ×2
GLOVE BIOGEL PI INDICATOR 8 (GLOVE) ×2
GOWN STRL REUS W/ TWL LRG LVL3 (GOWN DISPOSABLE) ×1 IMPLANT
GOWN STRL REUS W/TWL LRG LVL3 (GOWN DISPOSABLE) ×3
GOWN STRL REUS W/TWL XL LVL3 (GOWN DISPOSABLE) ×5 IMPLANT
NDL HYPO 25X1 1.5 SAFETY (NEEDLE) IMPLANT
NEEDLE HYPO 25X1 1.5 SAFETY (NEEDLE) ×3 IMPLANT
NS IRRIG 1000ML POUR BTL (IV SOLUTION) ×3 IMPLANT
PACK BASIN DAY SURGERY FS (CUSTOM PROCEDURE TRAY) ×3 IMPLANT
PAD ABD 8X10 STRL (GAUZE/BANDAGES/DRESSINGS) ×3 IMPLANT
PADDING CAST ABS 4INX4YD NS (CAST SUPPLIES) ×2
PADDING CAST ABS COTTON 4X4 ST (CAST SUPPLIES) ×1 IMPLANT
SPONGE GAUZE 4X4 12PLY (GAUZE/BANDAGES/DRESSINGS) ×3 IMPLANT
STOCKINETTE 4X48 STRL (DRAPES) ×3 IMPLANT
SUT ETHILON 4 0 PS 2 18 (SUTURE) ×3 IMPLANT
SYR BULB 3OZ (MISCELLANEOUS) ×3 IMPLANT
SYR CONTROL 10ML LL (SYRINGE) ×2 IMPLANT
TOWEL OR 17X24 6PK STRL BLUE (TOWEL DISPOSABLE) ×6 IMPLANT
UNDERPAD 30X30 INCONTINENT (UNDERPADS AND DIAPERS) ×3 IMPLANT

## 2013-07-11 NOTE — Transfer of Care (Signed)
Immediate Anesthesia Transfer of Care Note  Patient: Tara Branch  Procedure(s) Performed: Procedure(s): RIGHT CARPAL TUNNEL RELEASE (Right)  Patient Location: PACU  Anesthesia Type:General  Level of Consciousness: awake and patient cooperative  Airway & Oxygen Therapy: Patient Spontanous Breathing and Patient connected to face mask oxygen  Post-op Assessment: Report given to PACU RN and Post -op Vital signs reviewed and stable  Post vital signs: Reviewed and stable  Complications: No apparent anesthesia complications

## 2013-07-11 NOTE — Brief Op Note (Signed)
07/11/2013  1:12 PM  PATIENT:  Tara Branch  33 y.o. female  PRE-OPERATIVE DIAGNOSIS:  Right Carpal Tunnel Syndrome  POST-OPERATIVE DIAGNOSIS:  right carpal tunnel syndrome  PROCEDURE:  Procedure(s): RIGHT CARPAL TUNNEL RELEASE (Right)  SURGEON:  Surgeon(s) and Role:    * Tami RibasKevin R Jamarr Treinen, MD - Primary  PHYSICIAN ASSISTANT:   ASSISTANTS: Annye Ruskobert Dasnoit, PA   ANESTHESIA:   general  EBL:     BLOOD ADMINISTERED:none  DRAINS: none   LOCAL MEDICATIONS USED:  MARCAINE     SPECIMEN:  No Specimen  DISPOSITION OF SPECIMEN:  N/A  COUNTS:  YES  TOURNIQUET:   Total Tourniquet Time Documented: Upper Arm (Right) - 12 minutes Total: Upper Arm (Right) - 12 minutes   DICTATION: .Other Dictation: Dictation Number [id number]  PLAN OF CARE: Discharge to home after PACU  PATIENT DISPOSITION:  PACU - hemodynamically stable.

## 2013-07-11 NOTE — Discharge Instructions (Addendum)

## 2013-07-11 NOTE — Anesthesia Postprocedure Evaluation (Signed)
  Anesthesia Post-op Note  Patient: Tara Branch  Procedure(s) Performed: Procedure(s): RIGHT CARPAL TUNNEL RELEASE (Right)  Patient Location: PACU  Anesthesia Type:General  Level of Consciousness: awake, alert  and oriented  Airway and Oxygen Therapy: Patient Spontanous Breathing  Post-op Pain: mild  Post-op Assessment: Post-op Vital signs reviewed  Post-op Vital Signs: Reviewed  Complications: No apparent anesthesia complications

## 2013-07-11 NOTE — Anesthesia Procedure Notes (Signed)
Procedure Name: LMA Insertion Date/Time: 07/11/2013 12:49 PM Performed by: Nazareth Kirk Pre-anesthesia Checklist: Patient identified, Emergency Drugs available, Suction available and Patient being monitored Patient Re-evaluated:Patient Re-evaluated prior to inductionOxygen Delivery Method: Circle System Utilized Preoxygenation: Pre-oxygenation with 100% oxygen Intubation Type: IV induction Ventilation: Mask ventilation without difficulty LMA: LMA inserted LMA Size: 4.0 Number of attempts: 1 Airway Equipment and Method: bite block Placement Confirmation: positive ETCO2 Tube secured with: Tape Dental Injury: Teeth and Oropharynx as per pre-operative assessment

## 2013-07-11 NOTE — Op Note (Signed)
[id number] 

## 2013-07-11 NOTE — H&P (Signed)
  Tara Branch is an 33 y.o. female.   Chief Complaint: carpal tunnel syndrome HPI: 33 yo lhd female with 3.5 years of bilateral carpal tunnel syndrome.  This has been bothersome to her.  She has pins and needles sensation in fingers that is worsened with driving.  She drops things and has issues with dexterity.  Nocturnal symptoms that wake her. Positive nerve conduction studies. She wishes to have carpal tunnel release.  Past Medical History  Diagnosis Date  . No pertinent past medical history   . CTS (carpal tunnel syndrome)     right    Past Surgical History  Procedure Laterality Date  . Cesarean section    . Tubal ligation    . Wisdom tooth extraction      History reviewed. No pertinent family history. Social History:  reports that she has never smoked. She has never used smokeless tobacco. She reports that she drinks alcohol. She reports that she does not use illicit drugs.  Allergies: No Known Allergies  No prescriptions prior to admission    No results found for this or any previous visit (from the past 48 hour(s)).  No results found.   A comprehensive review of systems was negative.  Height 5\' 4"  (1.626 m), weight 235 lb (106.595 kg), last menstrual period 06/06/2013.  General appearance: alert, cooperative and appears stated age Head: Normocephalic, without obvious abnormality, atraumatic Neck: supple, symmetrical, trachea midline Resp: clear to auscultation bilaterally Cardio: regular rate and rhythm GI: non tender Extremities: intact sensation and capillary refill all digits, though thumb, index and long fingers feel different.  +epl/fpl/io.  no wounds. Pulses: 2+ and symmetric Skin: Skin color, texture, turgor normal. No rashes or lesions Neurologic: Grossly normal Incision/Wound: none  Assessment/Plan Right carpal tunnel syndrome.  Non operative and operative treatment options were discussed with the patient and patient wishes to proceed with operative  treatment. Risks, benefits, and alternatives of surgery were discussed and the patient agrees with the plan of care.   Tara Branch 07/11/2013, 10:28 AM

## 2013-07-11 NOTE — Anesthesia Preprocedure Evaluation (Signed)
Anesthesia Evaluation  Patient identified by MRN, date of birth, ID band Patient awake    Reviewed: Allergy & Precautions, H&P , NPO status , Patient's Chart, lab work & pertinent test results  Airway Mallampati: I TM Distance: >3 FB Neck ROM: Full    Dental  (+) Teeth Intact and Dental Advisory Given   Pulmonary  breath sounds clear to auscultation        Cardiovascular Rhythm:Regular Rate:Normal     Neuro/Psych    GI/Hepatic   Endo/Other    Renal/GU      Musculoskeletal   Abdominal   Peds  Hematology   Anesthesia Other Findings   Reproductive/Obstetrics                           Anesthesia Physical Anesthesia Plan  ASA: I  Anesthesia Plan: General   Post-op Pain Management:    Induction: Intravenous  Airway Management Planned: LMA  Additional Equipment:   Intra-op Plan:   Post-operative Plan: Extubation in OR  Informed Consent: I have reviewed the patients History and Physical, chart, labs and discussed the procedure including the risks, benefits and alternatives for the proposed anesthesia with the patient or authorized representative who has indicated his/her understanding and acceptance.   Dental advisory given  Plan Discussed with: Anesthesiologist, CRNA and Surgeon  Anesthesia Plan Comments:         Anesthesia Quick Evaluation

## 2013-07-12 ENCOUNTER — Encounter (HOSPITAL_BASED_OUTPATIENT_CLINIC_OR_DEPARTMENT_OTHER): Payer: Self-pay | Admitting: Orthopedic Surgery

## 2013-07-12 NOTE — Op Note (Signed)
Tara Branch, Tara Branch              ACCOUNT NO.:  [id number]  MEDICAL RECORD NO.:  [id number]  LOCATION:                                 FACILITY:  PHYSICIAN:  Betha Loa, MD             DATE OF BIRTH:  DATE OF PROCEDURE:  07/11/2013 DATE OF DISCHARGE:                              OPERATIVE REPORT   PREOPERATIVE DIAGNOSIS:  Right carpal tunnel syndrome.  POSTOPERATIVE DIAGNOSIS:  Right carpal tunnel syndrome.  PROCEDURE:  Right carpal tunnel release.  SURGEON:  Betha Loa, MD  ASSISTANT:  Marveen Reeks Dasnoit, PA-C  ANESTHESIA:  General.  IV FLUIDS:  Per anesthesia flow sheet.  ESTIMATED BLOOD LOSS:  Minimal.  COMPLICATIONS:  None.  SPECIMENS:  None.  TOURNIQUET TIME:  12 minutes.  DISPOSITION:  Stable to PACU.  INDICATIONS:  Tara Branch is a 33 year old female who has had a carpal tunnel syndrome for 3-1/2 years.  This continued to bother her.  She has positive nerve conduction studies.  She has nocturnal symptoms.  She wished to have a carpal tunnel release for management of symptoms. Risks, benefits and alternatives of the surgery were discussed including the risk of blood loss, infection, damage to nerves, vessels, tendons, ligaments, bone; failure of surgery; need for additional surgery, complications with wound healing, continued pain, and recurrent carpal tunnel syndrome.  She voiced understanding of these risks and elected to proceed.  OPERATIVE COURSE:  After being identified preoperatively by myself, the patient and I agreed upon the procedure and site of procedure.  Surgical site was marked.  The risks, benefits, and alternatives of surgery were reviewed and she wished to proceed.  Surgical consent had been signed. She was given IV Ancef as preoperative antibiotic prophylaxis.  She was transferred to the operating room and placed on the operating room table in supine position with the right upper extremity on an armboard. General anesthesia was induced  by the anesthesiologist.  The right upper extremity was prepped and draped in normal sterile orthopedic fashion. A surgical pause was performed between the surgeons, anesthesia, and operating room staff, and all were in agreement as to the patient, procedure, and site of procedure.  Tourniquet at the proximal aspect of the extremity was inflated to 250 mmHg after exsanguination of the limb with an Esmarch bandage.  Incision was made over the transverse carpal ligament.  Bipolar electrocautery was used in the subcutaneous tissues to obtain hemostasis.  Subcutaneous tissues were entered using spreading technique.  The palmar fascia was sharply incised.  There was a palmaris brevis proximally and the hypothenar musculature crossed very radially at the distal aspect.  The transverse carpal ligament was identified and incised sharply.  It was incised distally first.  Care was taken to ensure complete decompression distally which was the case.  It was incised proximally.  The scissors were used to split the distal aspect of the volar antebrachial fascia.  Finger was placed into the wound to ensure complete decompression which was the case.  The median nerve was inspected.  It was adherent to the radial leaflet of the transverse carpal ligament.  The motor branch was identified and was  intact.  The nerve was flattened.  The wound was copiously irrigated with sterile saline.  It was then closed with 4-0 nylon in a horizontal mattress fashion.  It was injected with 9 mL of 0.25% plain Marcaine to aid in postoperative analgesia.  It was dressed with sterile Xeroform, 4x4s, an ABD and wrapped with Kerlix and Ace bandage.  Tourniquet was deflated to 12 minutes.  Fingertips were pink with brisk capillary refill after deflation of the tourniquet.  Operative drapes were broken down.  The patient was awoken from anesthesia safely.  She was transferred back to stretcher and taken to PACU in stable  condition.  I will see her back in the office in 1 week for postoperative followup.  I will give her Norco 5/325, 1-2 p.o. q.6 hours p.r.n. pain, dispensed #30.     Betha LoaKevin Yoselin Amerman, MD     KK/MEDQ  D:  07/11/2013  T:  07/12/2013  Job:  [id number]

## 2013-10-21 ENCOUNTER — Encounter (HOSPITAL_BASED_OUTPATIENT_CLINIC_OR_DEPARTMENT_OTHER): Payer: Self-pay | Admitting: *Deleted

## 2013-10-21 ENCOUNTER — Other Ambulatory Visit: Payer: Self-pay | Admitting: Orthopedic Surgery

## 2013-10-21 DIAGNOSIS — G5600 Carpal tunnel syndrome, unspecified upper limb: Secondary | ICD-10-CM

## 2013-10-21 HISTORY — DX: Carpal tunnel syndrome, unspecified upper limb: G56.00

## 2013-10-28 ENCOUNTER — Encounter (HOSPITAL_BASED_OUTPATIENT_CLINIC_OR_DEPARTMENT_OTHER): Payer: Self-pay

## 2013-10-28 ENCOUNTER — Ambulatory Visit (HOSPITAL_BASED_OUTPATIENT_CLINIC_OR_DEPARTMENT_OTHER): Payer: Medicaid Other | Admitting: Anesthesiology

## 2013-10-28 ENCOUNTER — Ambulatory Visit (HOSPITAL_BASED_OUTPATIENT_CLINIC_OR_DEPARTMENT_OTHER)
Admission: RE | Admit: 2013-10-28 | Discharge: 2013-10-28 | Disposition: A | Discharge status: Home / Self Care | Payer: Medicaid Other | Source: Ambulatory Visit | Attending: Orthopedic Surgery | Admitting: Orthopedic Surgery

## 2013-10-28 ENCOUNTER — Encounter (HOSPITAL_BASED_OUTPATIENT_CLINIC_OR_DEPARTMENT_OTHER): Payer: Medicaid Other | Admitting: Anesthesiology

## 2013-10-28 ENCOUNTER — Encounter (HOSPITAL_BASED_OUTPATIENT_CLINIC_OR_DEPARTMENT_OTHER)
Admission: RE | Disposition: A | Discharge status: Home / Self Care | Payer: Self-pay | Source: Ambulatory Visit | Attending: Orthopedic Surgery

## 2013-10-28 DIAGNOSIS — G5600 Carpal tunnel syndrome, unspecified upper limb: Secondary | ICD-10-CM | POA: Insufficient documentation

## 2013-10-28 DIAGNOSIS — Z791 Long term (current) use of non-steroidal anti-inflammatories (NSAID): Secondary | ICD-10-CM | POA: Insufficient documentation

## 2013-10-28 HISTORY — PX: CARPAL TUNNEL RELEASE: SHX101

## 2013-10-28 HISTORY — DX: Irregular menstruation, unspecified: N92.6

## 2013-10-28 HISTORY — DX: Other seasonal allergic rhinitis: J30.2

## 2013-10-28 LAB — POCT HEMOGLOBIN-HEMACUE: Hemoglobin: 12.9 g/dL (ref 12.0–15.0)

## 2013-10-28 SURGERY — CARPAL TUNNEL RELEASE
Anesthesia: General | Laterality: Left

## 2013-10-28 MED ORDER — FENTANYL CITRATE 0.05 MG/ML IJ SOLN: 50.0000 ug | INTRAMUSCULAR | Status: DC | PRN

## 2013-10-28 MED ORDER — BUPIVACAINE HCL (PF) 0.25 % IJ SOLN
INTRAMUSCULAR | Status: DC | PRN
  Administered 2013-10-28: 10 mL

## 2013-10-28 MED ORDER — FENTANYL CITRATE 0.05 MG/ML IJ SOLN
INTRAMUSCULAR | Status: DC | PRN
  Administered 2013-10-28 (×2): 50 ug via INTRAVENOUS

## 2013-10-28 MED ORDER — PROMETHAZINE HCL 25 MG/ML IJ SOLN: 6.2500 mg | INTRAMUSCULAR | Status: DC | PRN

## 2013-10-28 MED ORDER — OXYCODONE HCL 5 MG/5ML PO SOLN: 5.0000 mg | Freq: Once | ORAL | Status: DC | PRN

## 2013-10-28 MED ORDER — HYDROCODONE-ACETAMINOPHEN 5-325 MG PO TABS: ORAL_TABLET | ORAL | Status: DC

## 2013-10-28 MED ORDER — ONDANSETRON HCL 4 MG/2ML IJ SOLN
INTRAMUSCULAR | Status: DC | PRN
  Administered 2013-10-28: 4 mg via INTRAVENOUS

## 2013-10-28 MED ORDER — OXYCODONE HCL 5 MG PO TABS: 5.0000 mg | ORAL_TABLET | Freq: Once | ORAL | Status: DC | PRN

## 2013-10-28 MED ORDER — MIDAZOLAM HCL 2 MG/ML PO SYRP
12.0000 mg | ORAL_SOLUTION | Freq: Once | ORAL | Status: DC | PRN
Start: 2013-10-28 — End: 2013-10-28

## 2013-10-28 MED ORDER — BUPIVACAINE HCL (PF) 0.25 % IJ SOLN
INTRAMUSCULAR | Status: AC
  Filled 2013-10-28: qty 30

## 2013-10-28 MED ORDER — CEFAZOLIN SODIUM-DEXTROSE 2-3 GM-% IV SOLR
2.0000 g | INTRAVENOUS | Status: AC
  Administered 2013-10-28: 2 g via INTRAVENOUS

## 2013-10-28 MED ORDER — DEXAMETHASONE SODIUM PHOSPHATE 4 MG/ML IJ SOLN
INTRAMUSCULAR | Status: DC | PRN
  Administered 2013-10-28: 10 mg via INTRAVENOUS

## 2013-10-28 MED ORDER — KETOROLAC TROMETHAMINE 30 MG/ML IJ SOLN
INTRAMUSCULAR | Status: DC | PRN
  Administered 2013-10-28: 30 mg via INTRAVENOUS

## 2013-10-28 MED ORDER — LIDOCAINE HCL (CARDIAC) 20 MG/ML IV SOLN
INTRAVENOUS | Status: DC | PRN
  Administered 2013-10-28: 50 mg via INTRAVENOUS

## 2013-10-28 MED ORDER — MIDAZOLAM HCL 2 MG/2ML IJ SOLN: 1.0000 mg | INTRAMUSCULAR | Status: DC | PRN

## 2013-10-28 MED ORDER — HYDROMORPHONE HCL PF 1 MG/ML IJ SOLN
0.2500 mg | INTRAMUSCULAR | Status: DC | PRN
Start: 2013-10-28 — End: 2013-10-28
  Administered 2013-10-28: 0.5 mg via INTRAVENOUS
  Administered 2013-10-28 (×2): 0.25 mg via INTRAVENOUS
  Administered 2013-10-28: 0.5 mg via INTRAVENOUS

## 2013-10-28 MED ORDER — CEFAZOLIN SODIUM-DEXTROSE 2-3 GM-% IV SOLR
INTRAVENOUS | Status: AC
  Filled 2013-10-28: qty 50

## 2013-10-28 MED ORDER — MIDAZOLAM HCL 2 MG/2ML IJ SOLN
INTRAMUSCULAR | Status: AC
  Filled 2013-10-28: qty 2

## 2013-10-28 MED ORDER — CHLORHEXIDINE GLUCONATE 4 % EX LIQD: 60.0000 mL | Freq: Once | CUTANEOUS | Status: DC

## 2013-10-28 MED ORDER — HYDROMORPHONE HCL PF 1 MG/ML IJ SOLN
INTRAMUSCULAR | Status: AC
  Filled 2013-10-28: qty 1

## 2013-10-28 MED ORDER — FENTANYL CITRATE 0.05 MG/ML IJ SOLN
INTRAMUSCULAR | Status: AC
  Filled 2013-10-28: qty 4

## 2013-10-28 MED ORDER — MIDAZOLAM HCL 5 MG/5ML IJ SOLN
INTRAMUSCULAR | Status: DC | PRN
  Administered 2013-10-28: 2 mg via INTRAVENOUS

## 2013-10-28 MED ORDER — PROPOFOL 10 MG/ML IV BOLUS
INTRAVENOUS | Status: DC | PRN
  Administered 2013-10-28: 260 mg via INTRAVENOUS

## 2013-10-28 MED ORDER — LACTATED RINGERS IV SOLN
INTRAVENOUS | Status: DC
  Administered 2013-10-28: 13:00:00 via INTRAVENOUS

## 2013-10-28 SURGICAL SUPPLY — 41 items
BANDAGE ELASTIC 3 VELCRO ST LF (GAUZE/BANDAGES/DRESSINGS) ×3 IMPLANT
BLADE 15 SAFETY STRL DISP (BLADE) ×4 IMPLANT
BLADE MINI RND TIP GREEN BEAV (BLADE) IMPLANT
BLADE SURG 15 STRL LF DISP TIS (BLADE) IMPLANT
BLADE SURG 15 STRL SS (BLADE) ×6
BNDG CMPR 9X4 STRL LF SNTH (GAUZE/BANDAGES/DRESSINGS) ×1
BNDG ESMARK 4X9 LF (GAUZE/BANDAGES/DRESSINGS) ×2 IMPLANT
BNDG GAUZE ELAST 4 BULKY (GAUZE/BANDAGES/DRESSINGS) ×3 IMPLANT
CHLORAPREP W/TINT 26ML (MISCELLANEOUS) ×3 IMPLANT
CORDS BIPOLAR (ELECTRODE) ×3 IMPLANT
COVER MAYO STAND STRL (DRAPES) ×3 IMPLANT
COVER TABLE BACK 60X90 (DRAPES) ×3 IMPLANT
CUFF TOURNIQUET SINGLE 18IN (TOURNIQUET CUFF) ×3 IMPLANT
DRAPE EXTREMITY T 121X128X90 (DRAPE) ×3 IMPLANT
DRAPE SURG 17X23 STRL (DRAPES) ×3 IMPLANT
DRSG PAD ABDOMINAL 8X10 ST (GAUZE/BANDAGES/DRESSINGS) ×3 IMPLANT
GAUZE SPONGE 4X4 12PLY STRL (GAUZE/BANDAGES/DRESSINGS) ×3 IMPLANT
GAUZE XEROFORM 1X8 LF (GAUZE/BANDAGES/DRESSINGS) ×3 IMPLANT
GLOVE BIO SURGEON STRL SZ 6.5 (GLOVE) ×1 IMPLANT
GLOVE BIO SURGEON STRL SZ7.5 (GLOVE) ×3 IMPLANT
GLOVE BIO SURGEONS STRL SZ 6.5 (GLOVE) ×1
GLOVE BIOGEL M 7.0 STRL (GLOVE) ×2 IMPLANT
GLOVE BIOGEL PI IND STRL 7.5 (GLOVE) IMPLANT
GLOVE BIOGEL PI IND STRL 8 (GLOVE) ×1 IMPLANT
GLOVE BIOGEL PI INDICATOR 7.5 (GLOVE) ×2
GLOVE BIOGEL PI INDICATOR 8 (GLOVE) ×2
GOWN STRL REUS W/ TWL LRG LVL3 (GOWN DISPOSABLE) ×1 IMPLANT
GOWN STRL REUS W/TWL LRG LVL3 (GOWN DISPOSABLE) ×3
GOWN STRL REUS W/TWL XL LVL3 (GOWN DISPOSABLE) ×3 IMPLANT
NDL HYPO 25X1 1.5 SAFETY (NEEDLE) IMPLANT
NEEDLE HYPO 25X1 1.5 SAFETY (NEEDLE) ×3 IMPLANT
NS IRRIG 1000ML POUR BTL (IV SOLUTION) ×3 IMPLANT
PACK BASIN DAY SURGERY FS (CUSTOM PROCEDURE TRAY) ×3 IMPLANT
PADDING CAST ABS 4INX4YD NS (CAST SUPPLIES) ×2
PADDING CAST ABS COTTON 4X4 ST (CAST SUPPLIES) ×1 IMPLANT
STOCKINETTE 4X48 STRL (DRAPES) ×3 IMPLANT
SUT ETHILON 4 0 PS 2 18 (SUTURE) ×3 IMPLANT
SYR BULB 3OZ (MISCELLANEOUS) ×3 IMPLANT
SYR CONTROL 10ML LL (SYRINGE) ×2 IMPLANT
TOWEL OR 17X24 6PK STRL BLUE (TOWEL DISPOSABLE) ×6 IMPLANT
UNDERPAD 30X30 INCONTINENT (UNDERPADS AND DIAPERS) ×3 IMPLANT

## 2013-10-28 NOTE — Anesthesia Postprocedure Evaluation (Signed)
  Anesthesia Post-op Note  Patient: Tara Branch  Procedure(s) Performed: Procedure(s): LEFT CARPAL TUNNEL RELEASE (Left)  Patient Location: PACU  Anesthesia Type:General  Level of Consciousness: awake, alert  and oriented  Airway and Oxygen Therapy: Patient Spontanous Breathing and Patient connected to nasal cannula oxygen  Post-op Pain: mild  Post-op Assessment: Post-op Vital signs reviewed, Patient's Cardiovascular Status Stable, Respiratory Function Stable, Patent Airway and Pain level controlled  Post-op Vital Signs: stable  Last Vitals:  Filed Vitals:   10/28/13 1442  BP:   Pulse: 87  Temp:   Resp: 16    Complications: No apparent anesthesia complications

## 2013-10-28 NOTE — Brief Op Note (Signed)
10/28/2013  2:15 PM  PATIENT:  Tara Branch  32 y.o. female  PRE-OPERATIVE DIAGNOSIS:  LEFT CARPAL TUNNEL SYNDROME  POST-OPERATIVE DIAGNOSIS:  LEFT CARPAL TUNNEL SYNDROME  PROCEDURE:  Procedure(s): LEFT CARPAL TUNNEL RELEASE (Left)  SURGEON:  Surgeon(s) and Role:    * Tami RibasKevin R Debbora Ang, MD - Primary  PHYSICIAN ASSISTANT:   ASSISTANTS: none   ANESTHESIA:   general  EBL:  Total I/O In: 1000 [I.V.:1000] Out: -   BLOOD ADMINISTERED:none  DRAINS: none   LOCAL MEDICATIONS USED:  MARCAINE     SPECIMEN:  No Specimen  DISPOSITION OF SPECIMEN:  N/A  COUNTS:  YES  TOURNIQUET:   Total Tourniquet Time Documented: Upper Arm (Left) - 17 minutes Total: Upper Arm (Left) - 17 minutes   DICTATION: .Other Dictation: Dictation Number [id number]  PLAN OF CARE: Discharge to home after PACU  PATIENT DISPOSITION:  PACU - hemodynamically stable.

## 2013-10-28 NOTE — Anesthesia Procedure Notes (Signed)
Procedure Name: LMA Insertion Performed by: York GricePEARSON, [place] W Pre-anesthesia Checklist: Patient identified, Timeout performed, Emergency Drugs available, Patient being monitored and Suction available Patient Re-evaluated:Patient Re-evaluated prior to inductionOxygen Delivery Method: Circle system utilized Preoxygenation: Pre-oxygenation with 100% oxygen Intubation Type: IV induction Ventilation: Mask ventilation without difficulty LMA: LMA with gastric port inserted LMA Size: 4.0 Number of attempts: 1 Placement Confirmation: breath sounds checked- equal and bilateral and positive ETCO2 Tube secured with: Tape Dental Injury: Teeth and Oropharynx as per pre-operative assessment

## 2013-10-28 NOTE — Discharge Instructions (Addendum)

## 2013-10-28 NOTE — Transfer of Care (Signed)
Immediate Anesthesia Transfer of Care Note  Patient: Tara BowlMonica Branch  Procedure(s) Performed: Procedure(s): LEFT CARPAL TUNNEL RELEASE (Left)  Patient Location: PACU  Anesthesia Type:General  Level of Consciousness: awake and sedated  Airway & Oxygen Therapy: Patient Spontanous Breathing and Patient connected to face mask oxygen  Post-op Assessment: Report given to PACU RN and Post -op Vital signs reviewed and stable  Post vital signs: Reviewed and stable  Complications: No apparent anesthesia complications

## 2013-10-28 NOTE — Op Note (Signed)
[id number] 

## 2013-10-28 NOTE — Anesthesia Preprocedure Evaluation (Signed)
Anesthesia Evaluation  Patient identified by MRN, date of birth, ID band Patient awake    Reviewed: Allergy & Precautions, H&P , NPO status , Patient's Chart, lab work & pertinent test results  Airway Mallampati: I TM Distance: >3 FB Neck ROM: Full    Dental  (+) Teeth Intact, Dental Advisory Given   Pulmonary neg pulmonary ROS,  breath sounds clear to auscultation        Cardiovascular negative cardio ROS  Rhythm:Regular Rate:Normal     Neuro/Psych    GI/Hepatic negative GI ROS, Neg liver ROS,   Endo/Other  negative endocrine ROS  Renal/GU negative Renal ROS     Musculoskeletal   Abdominal   Peds  Hematology negative hematology ROS (+)   Anesthesia Other Findings   Reproductive/Obstetrics                           Anesthesia Physical Anesthesia Plan  ASA: I  Anesthesia Plan: General   Post-op Pain Management:    Induction: Intravenous  Airway Management Planned: LMA  Additional Equipment:   Intra-op Plan:   Post-operative Plan: Extubation in OR  Informed Consent: I have reviewed the patients History and Physical, chart, labs and discussed the procedure including the risks, benefits and alternatives for the proposed anesthesia with the patient or authorized representative who has indicated his/her understanding and acceptance.     Plan Discussed with: CRNA and Surgeon  Anesthesia Plan Comments:         Anesthesia Quick Evaluation

## 2013-10-28 NOTE — H&P (Signed)
  Lowry BowlMonica Branch is an 33 y.o. female.   Chief Complaint: left carpal tunnel syndrome HPI: 33 yo lhd female with pins and needles in left hand.  Nocturnal symptoms.  Positive nerve conduction studies.  She has had a right carpal tunnel release and wishes to have a left carpal tunnel release for management of symptoms.  Past Medical History  Diagnosis Date  . CTS (carpal tunnel syndrome) 10/2013    left  . Seasonal allergies   . Irregular periods     LMP 05/2013 - has had BTL    Past Surgical History  Procedure Laterality Date  . Cesarean section  12/12/2003  . Wisdom tooth extraction    . Carpal tunnel release Right 07/11/2013    Procedure: RIGHT CARPAL TUNNEL RELEASE;  Surgeon: Tami RibasKevin R Mattis Featherly, MD;  Location: [facility];  Service: Orthopedics;  Laterality: Right;  . Tubal ligation  05/10/2006    History reviewed. No pertinent family history. Social History:  reports that she has never smoked. She has never used smokeless tobacco. She reports that she does not drink alcohol or use illicit drugs.  Allergies: No Known Allergies  Medications Prior to Admission  Medication Sig Dispense Refill  . ibuprofen (ADVIL,MOTRIN) 200 MG tablet Take 200 mg by mouth every 6 (six) hours as needed.        No results found for this or any previous visit (from the past 48 hour(s)).  No results found.   A comprehensive review of systems was negative.  Blood pressure 117/73, pulse 82, temperature 97.8 F (36.6 C), temperature source Oral, resp. rate 20, height 5\' 4"  (1.626 m), weight 108.863 kg (240 lb), SpO2 98.00%.  General appearance: alert, cooperative and appears stated age Head: Normocephalic, without obvious abnormality, atraumatic Neck: supple, symmetrical, trachea midline Resp: clear to auscultation bilaterally Cardio: regular rate and rhythm GI: non tender Extremities: intact sensation and capillary refill all digits.  +epl/fpl/io. Pulses: 2+ and symmetric Skin: Skin  color, texture, turgor normal. No rashes or lesions Neurologic: Grossly normal Incision/Wound: none  Assessment/Plan Left carpal tunnel syndrome.  Non operative and operative treatment options were discussed with the patient and patient wishes to proceed with operative treatment. Risks, benefits, and alternatives of surgery were discussed and the patient agrees with the plan of care.   Tami RibasKevin R Ted Leonhart 10/28/2013, 1:19 PM

## 2013-10-29 NOTE — Op Note (Signed)
NAMLowry Bowl:  Branch, Tara              ACCOUNT NO.:  [id number]  MEDICAL RECORD NO.:  [id number]  LOCATION:                                 FACILITY:  PHYSICIAN:  Betha LoaKevin Elier Zellars, MD             DATE OF BIRTH:  DATE OF PROCEDURE:  10/27/2013 DATE OF DISCHARGE:                              OPERATIVE REPORT   PREOPERATIVE DIAGNOSIS:  Left carpal tunnel syndrome.  POSTOP DIAGNOSIS:  Left carpal tunnel syndrome.  PROCEDURE:  Left carpal tunnel release.  SURGEON:  Betha LoaKevin Yamato Kopf, MD.  ASSISTANT:  None.  ANESTHESIA:  General IV fluids per anesthesia flow sheet.  ESTIMATED BLOOD LOSS:  Minimal.  COMPLICATIONS:  None.  SPECIMENS:  None.  TOURNIQUET TIME:  17 minutes.  DISPOSITION:  Stable to PACU.  INDICATIONS:  Ms. Tara OuchStrader is a 33 year old female, who was had pins and needle sensation in the left hand.  She has had positive nerve conduction studies.  She has nocturnal symptoms.  She has had a right carpal tunnel release and wishes to have a left carpal tunnel release for management of symptoms.  Risks, benefits, and alternatives of surgery were discussed including risk of blood loss, infection, damage to nerves, vessels, tendons, ligaments, bone; failure of surgery; need for additional surgery, complications with wound healing, continued pain, continued carpal tunnel syndrome.  She voiced understanding of these risks and elected to proceed.  OPERATIVE COURSE:  After being identified preoperatively by myself, the patient and I agreed upon procedure and site of procedure.  Surgical site was marked.  The risks, benefits, and alternatives of surgery were reviewed and she wished to proceed.  Surgical consent had been signed. She was given IV Ancef as preoperative antibiotic prophylaxis.  She was transferred to the operating room and placed on the operating room table in supine position.  The left upper extremity on arm board.  General anesthesia was induced by Anesthesiology.  Left upper  extremity was prepped and draped in normal sterile orthopedic fashion.  Surgical pause was performed between surgeons, anesthesia, operating staff, and all were in agreement as to the patient, procedure, site procedure. Tourniquet at the proximal aspect of the extremity was inflated to 250 mmHg after exsanguination of the limb with an Esmarch bandage.  A incision was made over the transverse carpal ligament.  This carried into subcutaneous tissues by spreading technique.  Bipolar electrocautery was used to obtain hemostasis.  The palmar fascia was sharply incised.  The transverse carpal ligament was identified and was incised sharply.  It was incised distally first.  Care was taken to ensure complete decompression distally.  It was then incised proximally. The scissors were used to split the distal aspect of the volar antebrachial fascia.  Finger was placed into the wound to ensure complete decompression, which was the case.  The nerve was inspected. The motor branch was identified and was intact.  The wound was copiously irrigated with sterile saline.  It was closed with 4-0 nylon in a horizontal mattress fashion.  It was injected with 10 mL of 0.25% plain Marcaine to aid in postoperative analgesia.  It was then dressed with sterile Xeroform, 4 x  4, and ABD and wrapped with Kerlix and Ace bandage.  Tourniquet was deflated at 17 minutes.  Fingertips were pink with brisk capillary refill after deflation of tourniquet.  Operative drapes were broken down.  The patient was awoken from anesthesia safely. She was transferred back to stretcher and taken to PACU in stable condition.  I will see her back in the office in 1 week for postoperative followup.  I will give her Norco 5/325 one to two p.o. q.6 hours p.r.n. pain, dispensed #30.     Betha LoaKevin Yianni Skilling, MD     KK/MEDQ  D:  10/28/2013  T:  10/29/2013  Job:  [id number]

## 2013-11-01 ENCOUNTER — Encounter (HOSPITAL_BASED_OUTPATIENT_CLINIC_OR_DEPARTMENT_OTHER): Payer: Self-pay | Admitting: Orthopedic Surgery

## 2014-04-24 ENCOUNTER — Encounter (HOSPITAL_BASED_OUTPATIENT_CLINIC_OR_DEPARTMENT_OTHER): Payer: Self-pay | Admitting: Orthopedic Surgery

## 2014-06-19 ENCOUNTER — Emergency Department (HOSPITAL_COMMUNITY)
Admission: EM | Admit: 2014-06-19 | Discharge: 2014-06-19 | Disposition: A | Discharge status: Home / Self Care | Payer: 59 | Source: Home / Self Care | Attending: Family Medicine | Admitting: Family Medicine

## 2014-06-19 ENCOUNTER — Encounter (HOSPITAL_COMMUNITY): Payer: Self-pay | Admitting: Emergency Medicine

## 2014-06-19 DIAGNOSIS — J0101 Acute recurrent maxillary sinusitis: Secondary | ICD-10-CM

## 2014-06-19 MED ORDER — MINOCYCLINE HCL 100 MG PO CAPS: 100.0000 mg | ORAL_CAPSULE | Freq: Two times a day (BID) | ORAL | Status: DC

## 2014-06-19 MED ORDER — HYDROCOD POLST-CHLORPHEN POLST 10-8 MG/5ML PO LQCR: 5.0000 mL | Freq: Two times a day (BID) | ORAL | Status: DC | PRN

## 2014-06-19 MED ORDER — IPRATROPIUM BROMIDE 0.06 % NA SOLN: 2.0000 | Freq: Four times a day (QID) | NASAL | Status: DC

## 2014-06-19 NOTE — ED Notes (Signed)
Pt states that she has had a cough and congestion since november

## 2014-06-19 NOTE — ED Provider Notes (Signed)
CSN: [id number]     Arrival date & time 06/19/14  1843 History   First MD Initiated Contact with Patient 06/19/14 1852     Chief Complaint  Patient presents with  . Nasal Congestion  . Cough  . Headache   (Consider location/radiation/quality/duration/timing/severity/associated sxs/prior Treatment) Patient is a 33 y.o. female presenting with cough.  Cough Cough characteristics:  Productive and harsh Sputum characteristics:  Yellow Severity:  Moderate Onset quality:  Gradual Duration:  4 weeks Progression:  Unchanged Chronicity:  New Smoker: no   Context: weather changes   Ineffective treatments:  Cough suppressants and decongestant Associated symptoms: rhinorrhea and sinus congestion   Associated symptoms: no chills, no fever, no shortness of breath and no wheezing     Past Medical History  Diagnosis Date  . CTS (carpal tunnel syndrome) 10/2013    left  . Seasonal allergies   . Irregular periods     LMP 05/2013 - has had BTL   Past Surgical History  Procedure Laterality Date  . Cesarean section  12/12/2003  . Wisdom tooth extraction    . Carpal tunnel release Right 07/11/2013    Procedure: RIGHT CARPAL TUNNEL RELEASE;  Surgeon: Tami RibasKevin R Kuzma, MD;  Location: [facility];  Service: Orthopedics;  Laterality: Right;  . Tubal ligation  05/10/2006  . Carpal tunnel release Left 10/28/2013    Procedure: LEFT CARPAL TUNNEL RELEASE;  Surgeon: Tami RibasKevin R Kuzma, MD;  Location: [facility];  Service: Orthopedics;  Laterality: Left;   No family history on file. History  Substance Use Topics  . Smoking status: Never Smoker   . Smokeless tobacco: Never Used  . Alcohol Use: No   OB History    Gravida Para Term Preterm AB TAB SAB Ectopic Multiple Living   4 2 2  2     2      Review of Systems  Constitutional: Negative.  Negative for fever and chills.  HENT: Positive for rhinorrhea.   Respiratory: Positive for cough. Negative for shortness of breath and  wheezing.   Cardiovascular: Negative.   Gastrointestinal: Negative.     Allergies  Review of patient's allergies indicates no known allergies.  Home Medications   Prior to Admission medications   Medication Sig Start Date End Date Taking? Authorizing Provider  chlorpheniramine-HYDROcodone (TUSSIONEX PENNKINETIC ER) 10-8 MG/5ML LQCR Take 5 mLs by mouth every 12 (twelve) hours as needed for cough. 06/19/14   Linna HoffJames D Davi Rotan, MD  HYDROcodone-acetaminophen [facility](NORCO) 5-325 MG per tablet 1-2 tabs po q6 hours prn pain 10/28/13   Betha LoaKevin Kuzma, MD  ibuprofen (ADVIL,MOTRIN) 200 MG tablet Take 200 mg by mouth every 6 (six) hours as needed.    Historical Provider, MD  ipratropium (ATROVENT) 0.06 % nasal spray Place 2 sprays into both nostrils 4 (four) times daily. 06/19/14   Linna HoffJames D Jessiah Wojnar, MD  minocycline (MINOCIN,DYNACIN) 100 MG capsule Take 1 capsule (100 mg total) by mouth 2 (two) times daily. 06/19/14   Linna HoffJames D Daily Doe, MD   BP 106/72 mmHg  Pulse 114  Temp(Src) 99.1 F (37.3 C) (Oral)  Resp 16  SpO2 98% Physical Exam  Constitutional: She is oriented to person, place, and time. She appears well-developed and well-nourished. No distress.  HENT:  Right Ear: External ear normal.  Left Ear: External ear normal.  Nose: Nose normal.  Mouth/Throat: Oropharynx is clear and moist.  Eyes: Conjunctivae are normal. Pupils are equal, round, and reactive to light.  Neck: Normal range of motion. Neck  supple.  Cardiovascular: Normal rate and normal heart sounds.   Pulmonary/Chest: Effort normal and breath sounds normal.  Lymphadenopathy:    She has no cervical adenopathy.  Neurological: She is alert and oriented to person, place, and time.  Skin: Skin is warm and dry.  Nursing note and vitals reviewed.   ED Course  Procedures (including critical care time) Labs Review Labs Reviewed - No data to display  Imaging Review No results found.   MDM   1. Acute recurrent maxillary sinusitis         Linna HoffJames D Anay Rathe, MD 06/19/14 [id number]

## 2016-09-15 ENCOUNTER — Encounter (INDEPENDENT_AMBULATORY_CARE_PROVIDER_SITE_OTHER): Payer: Self-pay

## 2016-09-15 ENCOUNTER — Ambulatory Visit (INDEPENDENT_AMBULATORY_CARE_PROVIDER_SITE_OTHER): Payer: 59 | Admitting: Neurology

## 2016-09-15 ENCOUNTER — Encounter: Payer: Self-pay | Admitting: Neurology

## 2016-09-15 DIAGNOSIS — G43709 Chronic migraine without aura, not intractable, without status migrainosus: Secondary | ICD-10-CM

## 2016-09-15 DIAGNOSIS — IMO0002 Reserved for concepts with insufficient information to code with codable children: Secondary | ICD-10-CM | POA: Insufficient documentation

## 2016-09-15 MED ORDER — RIZATRIPTAN BENZOATE 10 MG PO TBDP: 10.0000 mg | ORAL_TABLET | ORAL | 6 refills | Status: DC | PRN

## 2016-09-15 MED ORDER — TOPIRAMATE 100 MG PO TABS: 100.0000 mg | ORAL_TABLET | Freq: Two times a day (BID) | ORAL | 11 refills | Status: DC

## 2016-09-15 NOTE — Progress Notes (Signed)
PATIENT: Tara Branch DOB: 12-10-1980  Chief Complaint  Patient presents with  . Headaches    She is here with her friend, Tara Branch.  She has headaches 3-4 days each week that are unresponsive to OTC NSAIDS.  She sometimes gets blurred vision with her headaches.  Marland Kitchen PCP    Tara Gravel, MD     HISTORICAL  Tara Branch is a 36 years old left-handed female accompanied by her friend Tara Branch, seen in refer by her primary care Dr. Jani Branch for evaluation of headache.  I reviewed and summarized the referring note, she reported a history of allergy, sinusitis, Laboratory evaluation in March 2018, normal C-reactive protein, CPK, ANA, TSH, ESR,  She had a history of headaches since high school, starting from bilateral occipital region, spreading forward becoming lateralized severe pressure headache, she used to have it occasionally, but since 2017, she had increased frequency and severity of the headache, now she has headaches 4 times each week, severe, left eye tearing, with associated light noise smell sensitivity, movement make it worse, sleep in dark quiet room helps it, no significant nausea, her headache can last from a few hours to half day.  She has been taking frequent ibuprofen 800 mg on almost daily basis, once to 3 times each day.  Trigger for her migraines are bright sunlight, loud noise, strong smells, certain perfume, air fresher, sleep deprivation, weather change, stress.   REVIEW OF SYSTEMS: Full 14 system review of systems performed and notable only for as above  ALLERGIES: No Known Allergies  HOME MEDICATIONS: Current Outpatient Prescriptions  Medication Sig Dispense Refill  . chlorpheniramine-HYDROcodone (TUSSIONEX PENNKINETIC ER) 10-8 MG/5ML LQCR Take 5 mLs by mouth every 12 (twelve) hours as needed for cough. 115 mL 0  . HYDROcodone-acetaminophen (NORCO) 5-325 MG per tablet 1-2 tabs po q6 hours prn pain 30 tablet 0  . ibuprofen (ADVIL,MOTRIN) 200 MG tablet Take 200 mg by  mouth every 6 (six) hours as needed.    Marland Kitchen ipratropium (ATROVENT) 0.06 % nasal spray Place 2 sprays into both nostrils 4 (four) times daily. 15 mL 1  . minocycline (MINOCIN,DYNACIN) 100 MG capsule Take 1 capsule (100 mg total) by mouth 2 (two) times daily. 20 capsule 0   No current facility-administered medications for this visit.     PAST MEDICAL HISTORY: Past Medical History:  Diagnosis Date  . CTS (carpal tunnel syndrome) 10/2013   left  . Headache   . Irregular periods    LMP 05/2013 - has had BTL  . Seasonal allergies     PAST SURGICAL HISTORY: Past Surgical History:  Procedure Laterality Date  . CARPAL TUNNEL RELEASE Right 07/11/2013   Procedure: RIGHT CARPAL TUNNEL RELEASE;  Surgeon: Tennis Must, MD;  Location: [facility];  Service: Orthopedics;  Laterality: Right;  . CARPAL TUNNEL RELEASE Left 10/28/2013   Procedure: LEFT CARPAL TUNNEL RELEASE;  Surgeon: Tennis Must, MD;  Location: [facility];  Service: Orthopedics;  Laterality: Left;  . CESAREAN SECTION  12/12/2003  . TUBAL LIGATION  05/10/2006  . WISDOM TOOTH EXTRACTION      FAMILY HISTORY: Family History  Problem Relation Age of Onset  . Hypertension Mother   . Arthritis Mother   . Headache Mother   . Healthy Father     SOCIAL HISTORY:  Social History   Social History  . Marital status: Single    Spouse name: N/A  . Number of children: 2  . Years of  education: Currently 2nd yr college   Occupational History  . call center   . student    Social History Main Topics  . Smoking status: Never Smoker  . Smokeless tobacco: Never Used  . Alcohol use No  . Drug use: No  . Sexual activity: Not Currently    Birth control/ protection: Surgical   Other Topics Concern  . Not on file   Social History Narrative   Lives at home with mother, father and children.   Left-handed.   No caffeine use.     PHYSICAL EXAM   Vitals:   09/15/16 1149  BP: 128/81  Pulse: 89  Weight:  255 lb (115.7 kg)  Height: _0  (1.626 m)    Not recorded      Body mass index is 43.77 kg/m.  PHYSICAL EXAMNIATION:  Gen: NAD, conversant, well nourised, obese, well groomed                     Cardiovascular: Regular rate rhythm, no peripheral edema, warm, nontender. Eyes: Conjunctivae clear without exudates or hemorrhage Neck: Supple, no carotid bruits. Pulmonary: Clear to auscultation bilaterally   NEUROLOGICAL EXAM:  MENTAL STATUS: Speech:    Speech is normal; fluent and spontaneous with normal comprehension.  Cognition:     Orientation to time, place and person     Normal recent and remote memory     Normal Attention span and concentration     Normal Language, naming, repeating,spontaneous speech     Fund of knowledge   CRANIAL NERVES: CN II: Visual fields are full to confrontation. Fundoscopic exam is normal with sharp discs and no vascular changes. Pupils are round equal and briskly reactive to light. CN III, IV, VI: extraocular movement are normal. No ptosis. CN V: Facial sensation is intact to pinprick in all 3 divisions bilaterally. Corneal responses are intact.  CN VII: Face is symmetric with normal eye closure and smile. CN VIII: Hearing is normal to rubbing fingers CN IX, X: Palate elevates symmetrically. Phonation is normal. CN XI: Head turning and shoulder shrug are intact CN XII: Tongue is midline with normal movements and no atrophy.  MOTOR: There is no pronator drift of out-stretched arms. Muscle bulk and tone are normal. Muscle strength is normal.  REFLEXES: Reflexes are 2+ and symmetric at the biceps, triceps, knees, and ankles. Plantar responses are flexor.  SENSORY: Intact to light touch, pinprick, positional sensation and vibratory sensation are intact in fingers and toes.  COORDINATION: Rapid alternating movements and fine finger movements are intact. There is no dysmetria on finger-to-nose and heel-knee-shin.    GAIT/STANCE: Posture is  normal. Gait is steady with normal steps, base, arm swing, and turning. Heel and toe walking are normal. Tandem gait is normal.  Romberg is absent.   DIAGNOSTIC DATA (LABS, IMAGING, TESTING) - I reviewed patient records, labs, notes, testing and imaging myself where available.   ASSESSMENT AND PLAN  Tara Branch is a 36 y.o. female   Chronic migraines  Likely a component of medicine rebound headache  Topamax 100 mg twice a day as preventive medication  Maxalt as needed   Marcial Pacas, M.D. Ph.D.  [facility] [address] [place], [place] [address] Ph: [phone] Fax: [phone]  CC: Dr. Jani Branch

## 2016-11-11 ENCOUNTER — Encounter: Payer: Self-pay | Admitting: Neurology

## 2016-12-16 ENCOUNTER — Ambulatory Visit: Payer: 59 | Admitting: Neurology

## 2017-10-07 ENCOUNTER — Other Ambulatory Visit: Payer: Self-pay | Admitting: Obstetrics and Gynecology

## 2017-10-07 DIAGNOSIS — N6320 Unspecified lump in the left breast, unspecified quadrant: Secondary | ICD-10-CM

## 2017-10-08 ENCOUNTER — Other Ambulatory Visit: Payer: Self-pay | Admitting: Obstetrics and Gynecology

## 2017-10-08 DIAGNOSIS — R1033 Periumbilical pain: Secondary | ICD-10-CM

## 2017-10-09 ENCOUNTER — Other Ambulatory Visit: Payer: Self-pay

## 2017-10-14 ENCOUNTER — Ambulatory Visit
Admission: RE | Admit: 2017-10-14 | Discharge: 2017-10-14 | Disposition: A | Discharge status: Home / Self Care | Payer: Medicaid Other | Source: Ambulatory Visit | Attending: Obstetrics and Gynecology | Admitting: Obstetrics and Gynecology

## 2017-10-14 DIAGNOSIS — R1033 Periumbilical pain: Secondary | ICD-10-CM

## 2017-10-14 MED ORDER — IOPAMIDOL (ISOVUE-300) INJECTION 61%
125.0000 mL | Freq: Once | INTRAVENOUS | Status: AC | PRN
  Administered 2017-10-14: 125 mL via INTRAVENOUS

## 2018-03-08 ENCOUNTER — Emergency Department (HOSPITAL_COMMUNITY)
Admission: EM | Admit: 2018-03-08 | Discharge: 2018-03-09 | Disposition: A | Discharge status: Home / Self Care | Payer: 59 | Attending: Emergency Medicine | Admitting: Emergency Medicine

## 2018-03-08 ENCOUNTER — Other Ambulatory Visit: Payer: Self-pay

## 2018-03-08 ENCOUNTER — Emergency Department (HOSPITAL_COMMUNITY): Payer: 59

## 2018-03-08 ENCOUNTER — Encounter (HOSPITAL_COMMUNITY): Payer: Self-pay | Admitting: Emergency Medicine

## 2018-03-08 DIAGNOSIS — R002 Palpitations: Secondary | ICD-10-CM | POA: Insufficient documentation

## 2018-03-08 DIAGNOSIS — Z79899 Other long term (current) drug therapy: Secondary | ICD-10-CM | POA: Insufficient documentation

## 2018-03-08 DIAGNOSIS — J302 Other seasonal allergic rhinitis: Secondary | ICD-10-CM | POA: Diagnosis not present

## 2018-03-08 LAB — I-STAT BETA HCG BLOOD, ED (MC, WL, AP ONLY)

## 2018-03-08 LAB — BASIC METABOLIC PANEL
Anion gap: 11 (ref 5–15)
BUN: 9 mg/dL (ref 6–20)
CHLORIDE: 106 mmol/L (ref 98–111)
CO2: 23 mmol/L (ref 22–32)
Calcium: 9.3 mg/dL (ref 8.9–10.3)
Creatinine, Ser: 0.83 mg/dL (ref 0.44–1.00)
GFR calc Af Amer: >60 mL/min (ref >60)
GFR calc non Af Amer: >60 mL/min (ref >60)
Glucose, Bld: 106 mg/dL — ABNORMAL HIGH (ref 70–99)
Potassium: 4.2 mmol/L (ref 3.5–5.1)
Sodium: 140 mmol/L (ref 135–145)

## 2018-03-08 LAB — CBC
HCT: 38.9 % (ref 36.0–46.0)
HEMOGLOBIN: 12.7 g/dL (ref 12.0–15.0)
MCH: 30.2 pg (ref 26.0–34.0)
MCHC: 32.6 g/dL (ref 30.0–36.0)
MCV: 92.6 fL (ref 78.0–100.0)
Platelets: 335 10*3/uL (ref 150–400)
RBC: 4.2 MIL/uL (ref 3.87–5.11)
RDW: 12.8 % (ref 11.5–15.5)
WBC: 7.4 10*3/uL (ref 4.0–10.5)

## 2018-03-08 LAB — I-STAT TROPONIN, ED: TROPONIN I, POC: 0 ng/mL (ref 0.00–0.08)

## 2018-03-08 LAB — D-DIMER, QUANTITATIVE (NOT AT ARMC): D-Dimer, Quant: 0.42 ug/mL-FEU (ref 0.00–0.50)

## 2018-03-08 NOTE — ED Notes (Signed)
Pt located near lobby windows.

## 2018-03-08 NOTE — ED Notes (Signed)
Called for Pt to recheck vitals. No answer 

## 2018-03-08 NOTE — ED Triage Notes (Signed)
Pt to ER for evaluation of palpitations and racing heartbeat, rate at this time is 88. Pt in NAD. Reports it is intermittent in nature.

## 2018-03-08 NOTE — ED Provider Notes (Signed)
[facility]   CSN: [id number] Arrival date & time: 03/08/18  1737     History   Chief Complaint Chief Complaint  Patient presents with  . Palpitations    HPI Tara Branch is a [age] y.o. female.  The history is provided by the patient and medical records.  Palpitations   Associated symptoms include chest pain and shortness of breath.    [age] year old female with history of carpal tunnel, migraine headaches, seasonal allergies, PCOS, presenting to the ED with palpitations.  Patient reports this is been ongoing for about a week now but worse over the past 3 days.  States episodes are becoming more frequent.  States symptoms seem random in onset, sometimes occurring with exertion and other times at rest.  States occasionally she will have some chest pain and shortness of breath with this but not always.  States symptoms last anywhere from 30 minutes to 2 hours before resolving spontaneously.  She denies any diaphoresis, nausea, vomiting, dizziness, or weakness.  She denies any caffeine use.  Does report history of thyroid issues several years ago but has not been on medication in over 5 years.  She denies any cardiac history or history of arrhythmia.  No familial history of arrhythmia or sudden cardiac death.  She denies any exogenous estrogens, travel, prolonged immobilization, or surgery.  No history of DVT or PE.  During, evaluation, patient denies any chest pain but states she does feel "palpitations" in her throat.  Past Medical History:  Diagnosis Date  . CTS (carpal tunnel syndrome) 10/2013   left  . Headache   . Irregular periods    LMP 05/2013 - has had BTL  . Seasonal allergies     Patient Active Problem List   Diagnosis Date Noted  . Chronic migraine 09/15/2016  . Dysfunctional uterine bleeding 05/01/2012  . PCOS (polycystic ovarian syndrome) 05/01/2012    Past Surgical History:  Procedure Laterality Date  . CARPAL  TUNNEL RELEASE Right 07/11/2013   Procedure: RIGHT CARPAL TUNNEL RELEASE;  Surgeon: Tami RibasKevin R Kuzma, MD;  Location: [facility];  Service: Orthopedics;  Laterality: Right;  . CARPAL TUNNEL RELEASE Left 10/28/2013   Procedure: LEFT CARPAL TUNNEL RELEASE;  Surgeon: Tami RibasKevin R Kuzma, MD;  Location: [facility];  Service: Orthopedics;  Laterality: Left;  . CESAREAN SECTION  12/12/2003  . TUBAL LIGATION  05/10/2006  . WISDOM TOOTH EXTRACTION       OB History    Gravida  4   Para  2   Term  2   Preterm      AB  2   Living  2     SAB      TAB      Ectopic      Multiple      Live Births               Home Medications    Prior to Admission medications   Medication Sig Start Date End Date Taking? Authorizing Provider  chlorpheniramine-HYDROcodone (TUSSIONEX PENNKINETIC ER) 10-8 MG/5ML LQCR Take 5 mLs by mouth every 12 (twelve) hours as needed for cough. 06/19/14   Linna HoffKindl, James D, MD  HYDROcodone-acetaminophen [facility](NORCO) 5-325 MG per tablet 1-2 tabs po q6 hours prn pain 10/28/13   Betha LoaKuzma, Kevin, MD  ibuprofen (ADVIL,MOTRIN) 200 MG tablet Take 200 mg by mouth every 6 (six) hours as needed.    [provider]  ipratropium (ATROVENT) 0.06 % nasal  spray Place 2 sprays into both nostrils 4 (four) times daily. 06/19/14   Linna Hoff, MD  minocycline (MINOCIN,DYNACIN) 100 MG capsule Take 1 capsule (100 mg total) by mouth 2 (two) times daily. 06/19/14   Linna Hoff, MD  rizatriptan (MAXALT-MLT) 10 MG disintegrating tablet Take 1 tablet (10 mg total) by mouth as needed. May repeat in 2 hours if needed 09/15/16   Levert Feinstein, MD  topiramate (TOPAMAX) 100 MG tablet Take 1 tablet (100 mg total) by mouth 2 (two) times daily. 09/15/16   Levert Feinstein, MD    Family History Family History  Problem Relation Age of Onset  . Hypertension Mother   . Arthritis Mother   . Headache Mother   . Healthy Father     Social History Social History   Tobacco Use  .  Smoking status: Never Smoker  . Smokeless tobacco: Never Used  Substance Use Topics  . Alcohol use: No  . Drug use: No     Allergies   Patient has no known allergies.   Review of Systems Review of Systems  Respiratory: Positive for shortness of breath.   Cardiovascular: Positive for chest pain and palpitations.  All other systems reviewed and are negative.    Physical Exam Updated Vital Signs BP 134/85 (BP Location: Right Arm)   Pulse 77   Temp 98.8 F (37.1 C) (Oral)   Resp 14   LMP 02/05/2018   SpO2 100%   Physical Exam  Constitutional: She is oriented to person, place, and time. She appears well-developed and well-nourished.  HENT:  Head: Normocephalic and atraumatic.  Mouth/Throat: Oropharynx is clear and moist.  Eyes: Pupils are equal, round, and reactive to light. Conjunctivae and EOM are normal.  Neck: Normal range of motion.  Cardiovascular: Normal rate, regular rhythm and normal heart sounds.  Pulmonary/Chest: Effort normal and breath sounds normal. No stridor. No respiratory distress.  Abdominal: Soft. Bowel sounds are normal. There is no tenderness. There is no rebound.  Musculoskeletal: Normal range of motion.  Neurological: She is alert and oriented to person, place, and time.  Skin: Skin is warm and dry.  Psychiatric: She has a normal mood and affect.  Nursing note and vitals reviewed.    ED Treatments / Results  Labs (all labs ordered are listed, but only abnormal results are displayed) Labs Reviewed  BASIC METABOLIC PANEL - Abnormal; Notable for the following components:      Result Value   Glucose, Bld 106 (*)    All other components within normal limits  CBC  TSH  D-DIMER, QUANTITATIVE (NOT AT [facility])  I-STAT BETA HCG BLOOD, ED ([facility], [facility], [facility] ONLY)  I-STAT TROPONIN, ED    EKG None  Radiology Dg Chest 2 View  Result Date: 03/08/2018 CLINICAL DATA:  Palpitation with chest pain EXAM: CHEST - 2 VIEW COMPARISON:  None. FINDINGS: The heart  size and mediastinal contours are within normal limits. Both lungs are clear. The visualized skeletal structures are unremarkable. Bilateral nipple rings IMPRESSION: No active cardiopulmonary disease. Electronically Signed   By: Jasmine Pang M.D.   On: 03/08/2018 23:31    Procedures Procedures (including critical care time)  Medications Ordered in ED Medications - No data to display   Initial Impression / Assessment and Plan / ED Course  I have reviewed the triage vital signs and the nursing notes.  Pertinent labs & imaging results that were available during my care of the patient were reviewed by me and considered in  my medical decision making (see chart for details).  37 year old female here with palpitations, occasional chest pain and shortness of breath.  She is afebrile and nontoxic.  Vital signs are stable here, heart rate has been normal and below 100.  EKG is sinus rhythm without acute ischemic changes.  Initial labs from triage are overall reassuring.  After talking with patient, TSH, troponin, d-dimer, and chest x-ray were added, all of which are negative as well.  Patient has remained on telemetry here in the ED without any acute events, tachycardia, or noted arrhythmia.  Uncertain etiology of her palpitations.  She denies any caffeine use.  Recommended that she follow-up closely with her primary care doctor, possible consideration of Holter monitor for further evaluation if symptoms persist.  She will return here for any new or worsening symptoms.  Final Clinical Impressions(s) / ED Diagnoses   Final diagnoses:  Palpitations    ED Discharge Orders    None       Garlon Hatchet, PA-C 03/09/18 0335    Margarita Grizzle, MD 03/09/18 1807

## 2018-03-09 LAB — TSH: TSH: 0.509 u[IU]/mL (ref 0.350–4.500)

## 2018-03-09 NOTE — Discharge Instructions (Signed)
Labs and chest x-ray today were all normal. Recommend close follow-up with your primary care doctor.  Have him review your results from today's visit. Return to the ED for new or worsening symptoms.

## 2018-07-01 ENCOUNTER — Other Ambulatory Visit: Payer: 59

## 2018-08-24 ENCOUNTER — Ambulatory Visit
Admission: RE | Admit: 2018-08-24 | Discharge: 2018-08-24 | Disposition: A | Discharge status: Home / Self Care | Payer: 59 | Source: Ambulatory Visit | Attending: Obstetrics and Gynecology | Admitting: Obstetrics and Gynecology

## 2018-08-24 DIAGNOSIS — N6320 Unspecified lump in the left breast, unspecified quadrant: Secondary | ICD-10-CM

## 2018-11-30 ENCOUNTER — Other Ambulatory Visit: Payer: Self-pay | Admitting: Internal Medicine

## 2018-12-07 ENCOUNTER — Other Ambulatory Visit (HOSPITAL_COMMUNITY)
Admission: RE | Admit: 2018-12-07 | Discharge: 2018-12-07 | Disposition: A | Discharge status: Home / Self Care | Payer: 59 | Source: Ambulatory Visit | Attending: Internal Medicine | Admitting: Internal Medicine

## 2018-12-07 DIAGNOSIS — Z1159 Encounter for screening for other viral diseases: Secondary | ICD-10-CM | POA: Insufficient documentation

## 2018-12-08 LAB — NOVEL CORONAVIRUS, NAA (HOSP ORDER, SEND-OUT TO REF LAB; TAT 18-24 HRS): SARS-CoV-2, NAA: NOT DETECTED

## 2018-12-08 NOTE — Progress Notes (Signed)
LMOMTCB x 1 

## 2018-12-09 ENCOUNTER — Telehealth: Payer: Self-pay

## 2018-12-09 ENCOUNTER — Other Ambulatory Visit: Payer: Self-pay | Admitting: Internal Medicine

## 2018-12-09 DIAGNOSIS — R0600 Dyspnea, unspecified: Secondary | ICD-10-CM

## 2018-12-09 NOTE — Telephone Encounter (Signed)
Notes recorded by Deneise Lever, MD on 12/08/2018 at 10:36 AM EDT  Nasal swab test was negative for Covid virus infection  Called and spoke with pt letting her know the results of the COVID test and pt verbalized understanding. Nothing further needed.

## 2018-12-10 ENCOUNTER — Ambulatory Visit (INDEPENDENT_AMBULATORY_CARE_PROVIDER_SITE_OTHER): Payer: 59 | Admitting: Internal Medicine

## 2018-12-10 ENCOUNTER — Other Ambulatory Visit: Payer: Self-pay

## 2018-12-10 DIAGNOSIS — R0600 Dyspnea, unspecified: Secondary | ICD-10-CM

## 2018-12-10 LAB — PULMONARY FUNCTION TEST
DL/VA % pred: 136 %
DL/VA: 6.14 ml/min/mmHg/L
DLCO unc % pred: 137 %
DLCO unc: 30.53 ml/min/mmHg
FEF 25-75 Post: 4.62 L/sec
FEF 25-75 Pre: 3.79 L/sec
FEF2575-%Change-Post: 22 %
FEF2575-%Pred-Post: 153 %
FEF2575-%Pred-Pre: 125 %
FEV1-%Change-Post: 10 %
FEV1-%Pred-Post: 127 %
FEV1-%Pred-Pre: 114 %
FEV1-Post: 3.37 L
FEV1-Pre: 3.03 L
FEV1FVC-%Change-Post: 0 %
FEV1FVC-%Pred-Pre: 103 %
FEV6-%Change-Post: 10 %
FEV6-%Pred-Post: 123 %
FEV6-%Pred-Pre: 112 %
FEV6-Post: 3.83 L
FEV6-Pre: 3.48 L
FEV6FVC-%Pred-Post: 101 %
FEV6FVC-%Pred-Pre: 101 %
FVC-%Change-Post: 10 %
FVC-%Pred-Post: 121 %
FVC-%Pred-Pre: 110 %
FVC-Post: 3.83 L
FVC-Pre: 3.48 L
Post FEV1/FVC ratio: 88 %
Post FEV6/FVC ratio: 100 %
Pre FEV1/FVC ratio: 87 %
Pre FEV6/FVC Ratio: 100 %

## 2018-12-10 NOTE — Progress Notes (Signed)
B&A bronchodilator and DLCO performed.

## 2019-07-19 ENCOUNTER — Other Ambulatory Visit: Payer: Self-pay | Admitting: Internal Medicine

## 2019-07-19 DIAGNOSIS — Z1231 Encounter for screening mammogram for malignant neoplasm of breast: Secondary | ICD-10-CM

## 2019-08-15 IMAGING — MG DIGITAL DIAGNOSTIC BILATERAL MAMMOGRAM WITH TOMO AND CAD
6 of 10 series · 6 of 30 positions shown · non-contrast
Comparison: 07/04/2013 and earlier

CLINICAL DATA: Palpable abnormality in the LEFT breast. Patient
reported similar palpable abnormality in 8014 which was negative on
diagnostic mammogram and ultrasound. Patient states that abnormality
has recently gotten larger and is now tender. The patient's sister
was diagnosed with breast cancer at age 40.

EXAM:
DIGITAL DIAGNOSTIC BILATERAL MAMMOGRAM WITH CAD AND TOMO
ULTRASOUND LEFT BREAST

[L MLO synth-2D]
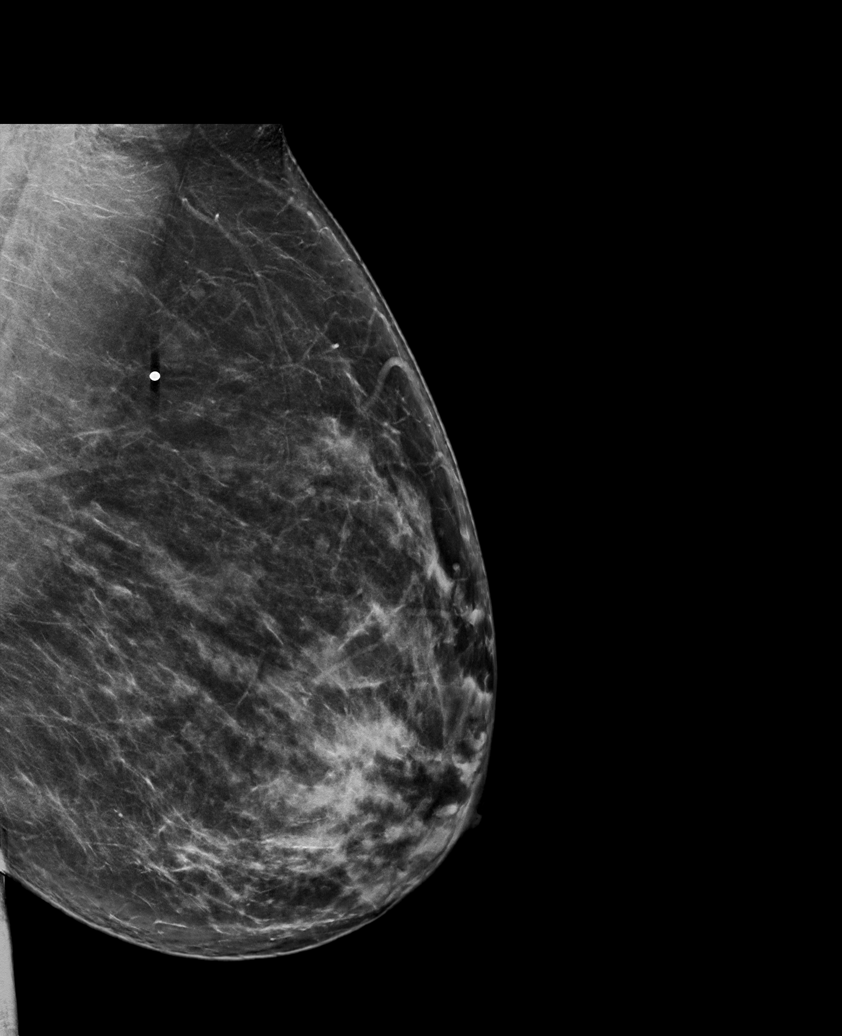

[R MLO synth-2D]
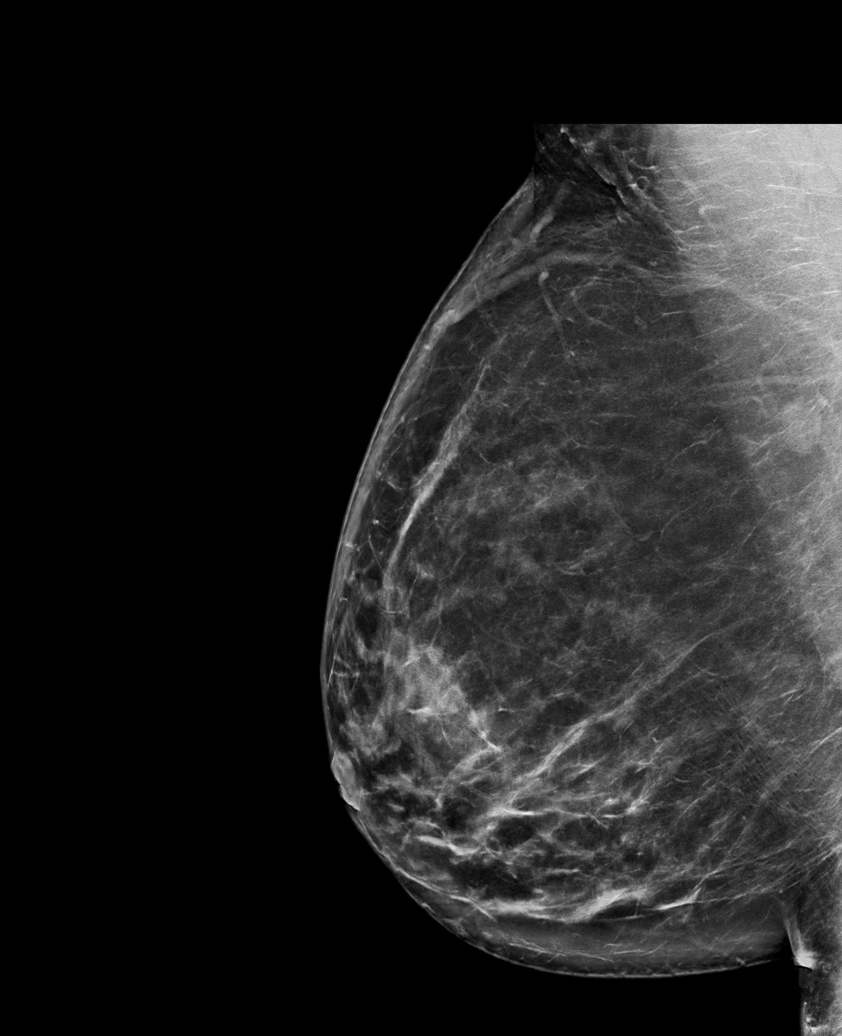

[L TAN synth-2D]
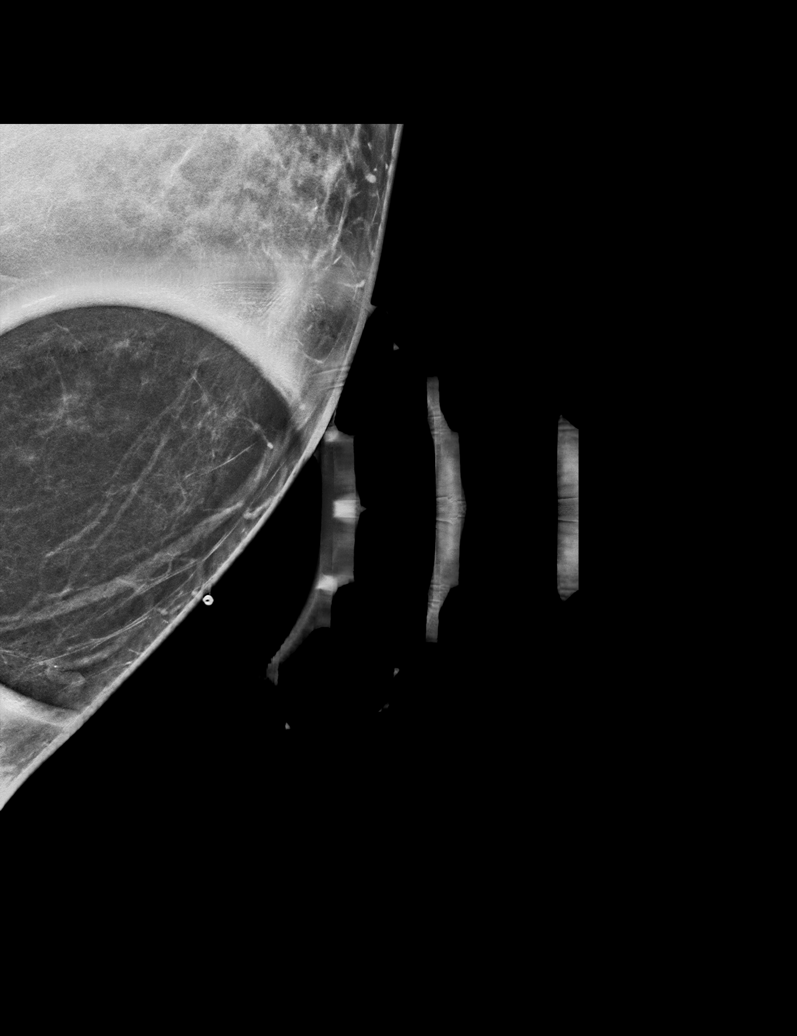

[L CC synth-2D]
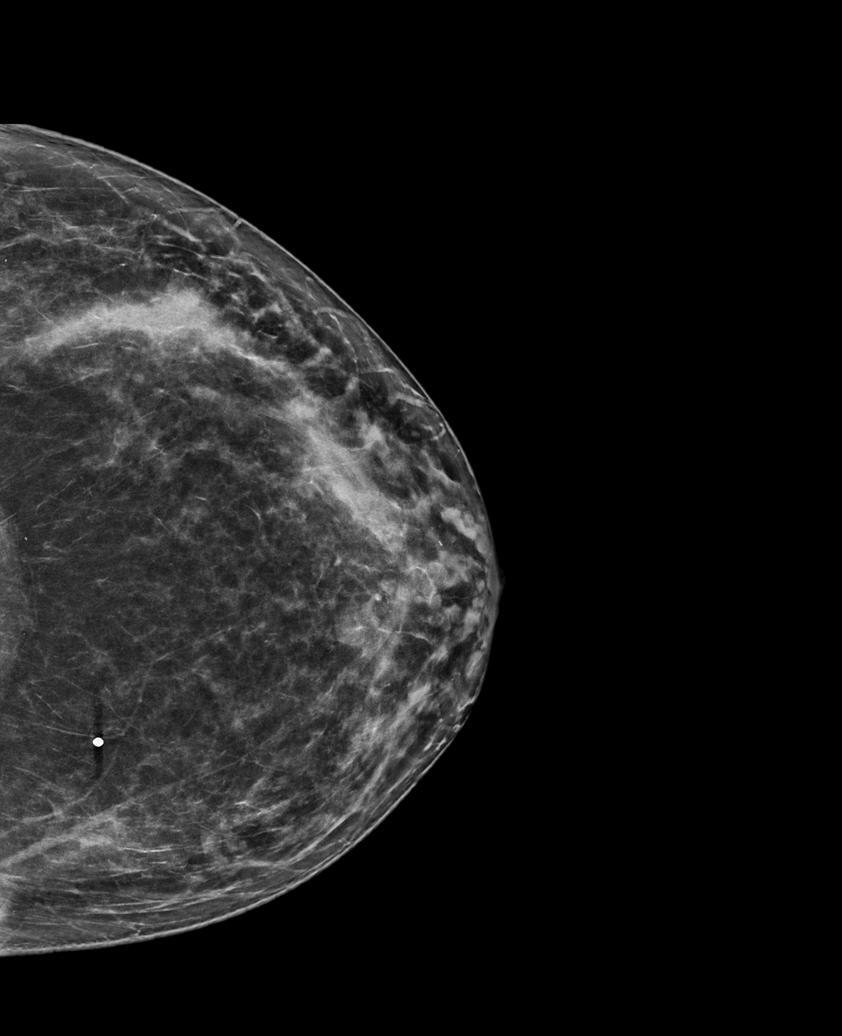

[R CC synth-2D]
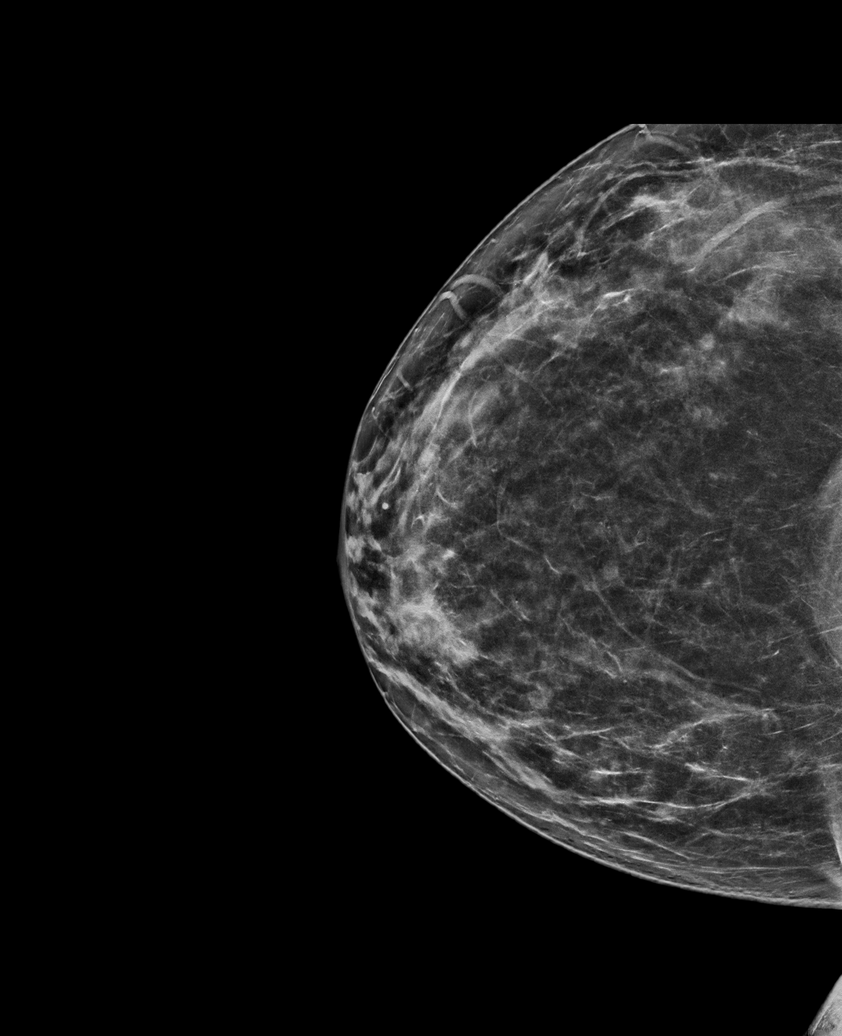

[R CC tomo · tomo slice 39/78.0]
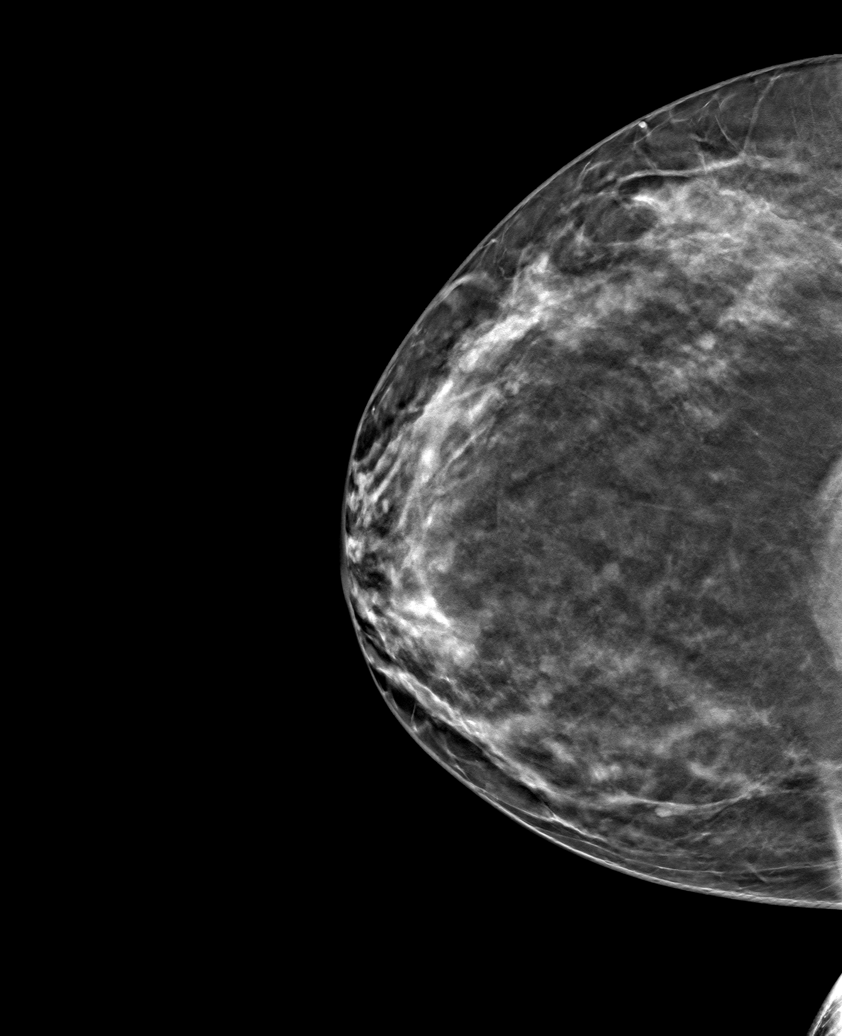

[6 of 30 positions shown; findings below may reference images not displayed]

ACR Breast Density Category c: The breast tissue is heterogeneously
dense, which may obscure small masses.
FINDINGS: No suspicious mass, distortion, or microcalcifications are
identified in either breast to suggest presence of malignancy. Spot
tangential view in the area concern in the UPPER INNER QUADRANT of
the LEFT breast is unremarkable.

Mammographic images were processed with CAD.

On physical exam, I palpate soft non-focal thickening in the UPPER
INNER QUADRANT of the LEFT breast in the area of patient's concern.
I palpate no discrete mass.

Targeted ultrasound is performed, showing normal appearing
fibrofatty tissue in the UPPER INNER QUADRANT. No suspicious mass,
distortion, or acoustic shadowing is demonstrated with ultrasound.
IMPRESSION: No mammographic or ultrasound evidence for malignancy.

RECOMMENDATION:
Given the patient's family history, recommend screening mammogram in
one year.(Code:AO-N-26U)

I have discussed the findings and recommendations with the patient.
Results were also provided in writing at the conclusion of the
visit. If applicable, a reminder letter will be sent to the patient
regarding the next appointment.

BI-RADS CATEGORY  1: Negative.

## 2019-08-15 IMAGING — US ULTRASOUND LEFT BREAST LIMITED
1 series · 2 of 2 positions shown · non-contrast
Comparison: 07/04/2013 and earlier

CLINICAL DATA: Palpable abnormality in the LEFT breast. Patient
reported similar palpable abnormality in 8014 which was negative on
diagnostic mammogram and ultrasound. Patient states that abnormality
has recently gotten larger and is now tender. The patient's sister
was diagnosed with breast cancer at age 40.

EXAM:
DIGITAL DIAGNOSTIC BILATERAL MAMMOGRAM WITH CAD AND TOMO
ULTRASOUND LEFT BREAST

[Series 1: ultrasound left breast limited · 0.07mm/px · 2 of 2 slices shown]
[im 1/2]
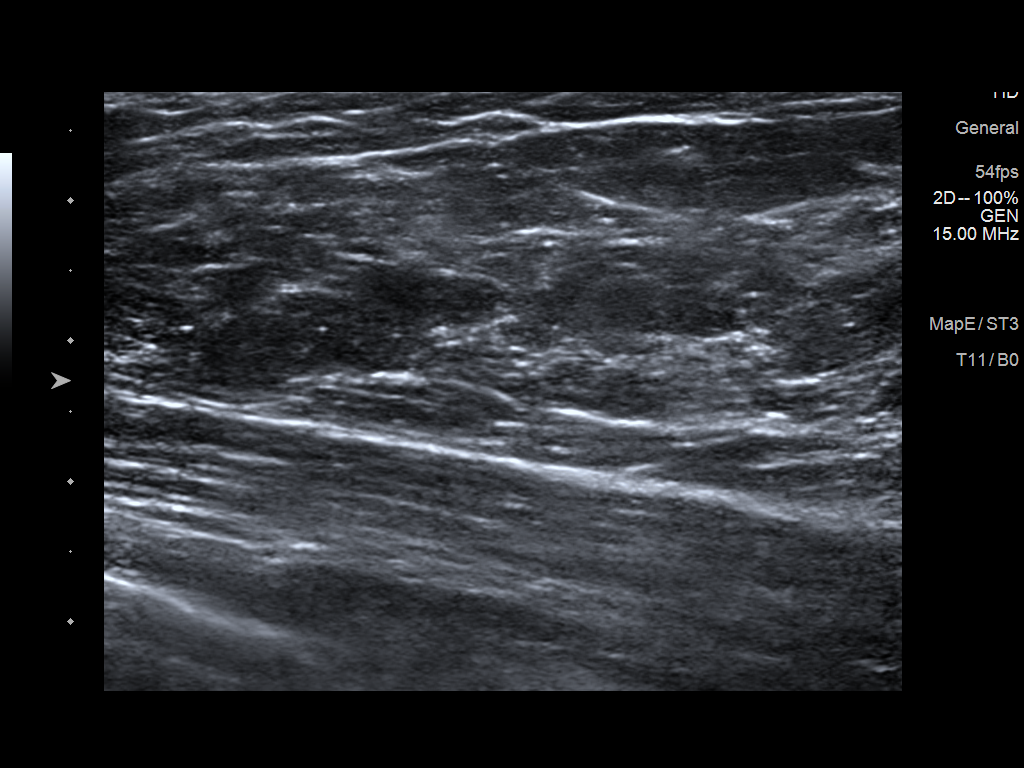
[im 2/2]
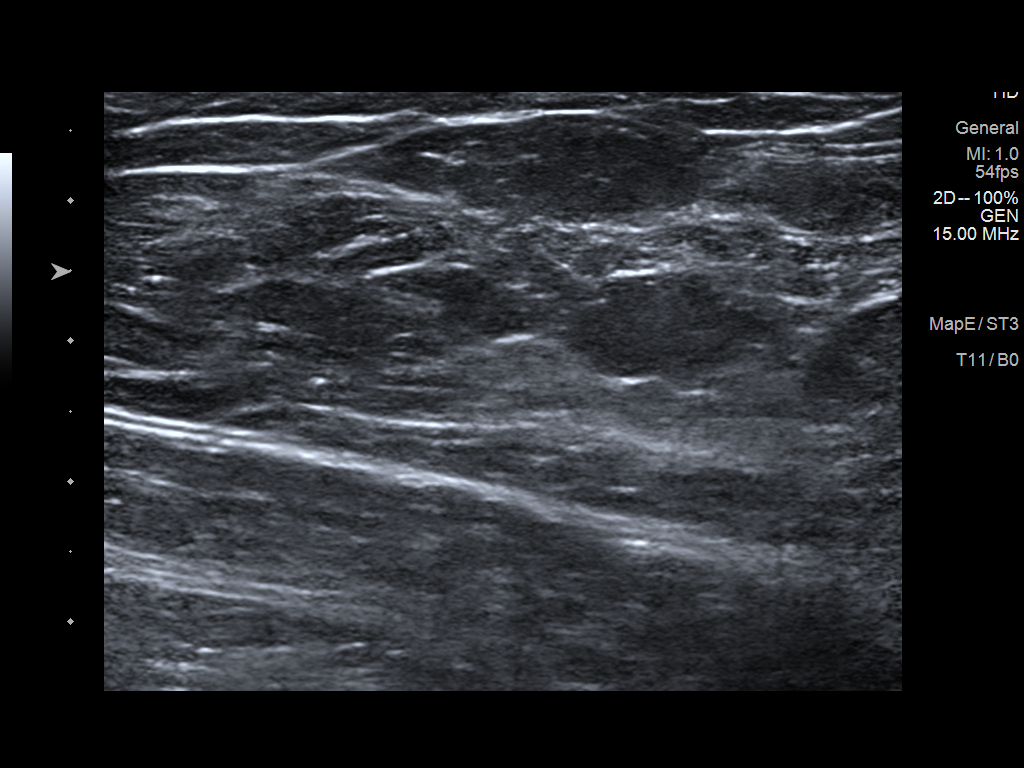

[2 of 2 positions shown; findings below may reference images not displayed]

ACR Breast Density Category c: The breast tissue is heterogeneously
dense, which may obscure small masses.
FINDINGS: No suspicious mass, distortion, or microcalcifications are
identified in either breast to suggest presence of malignancy. Spot
tangential view in the area concern in the UPPER INNER QUADRANT of
the LEFT breast is unremarkable.

Mammographic images were processed with CAD.

On physical exam, I palpate soft non-focal thickening in the UPPER
INNER QUADRANT of the LEFT breast in the area of patient's concern.
I palpate no discrete mass.

Targeted ultrasound is performed, showing normal appearing
fibrofatty tissue in the UPPER INNER QUADRANT. No suspicious mass,
distortion, or acoustic shadowing is demonstrated with ultrasound.
IMPRESSION: No mammographic or ultrasound evidence for malignancy.

RECOMMENDATION:
Given the patient's family history, recommend screening mammogram in
one year.(Code:AO-N-26U)

I have discussed the findings and recommendations with the patient.
Results were also provided in writing at the conclusion of the
visit. If applicable, a reminder letter will be sent to the patient
regarding the next appointment.

BI-RADS CATEGORY  1: Negative.

## 2019-08-25 ENCOUNTER — Ambulatory Visit: Payer: 59

## 2019-11-11 ENCOUNTER — Ambulatory Visit: Payer: Medicaid Other | Admitting: Family Medicine

## 2020-06-14 ENCOUNTER — Ambulatory Visit: Payer: Medicaid Other | Admitting: Obstetrics

## 2020-07-18 ENCOUNTER — Ambulatory Visit: Payer: Medicaid Other | Admitting: Obstetrics

## 2021-09-10 ENCOUNTER — Other Ambulatory Visit: Payer: Self-pay | Admitting: Surgery

## 2021-10-14 ENCOUNTER — Encounter (HOSPITAL_BASED_OUTPATIENT_CLINIC_OR_DEPARTMENT_OTHER): Payer: Self-pay | Admitting: Surgery

## 2021-10-14 ENCOUNTER — Other Ambulatory Visit: Payer: Self-pay

## 2021-10-15 NOTE — Progress Notes (Signed)

## 2021-10-22 NOTE — H&P (Signed)
?REFERRING PHYSICIAN: Linus Galas ? ?PROVIDER: Wayne Both, MD ? ?MRN: [id number] ?DOB: 1980/12/15 ? ?Subjective  ? ?Chief Complaint: New Patient (Hernia ) ? ? ?History of Present Illness: ?Tara Branch is a 41 y.o. female who is seen today as an office consultation for evaluation of New Patient (Hernia ) ?.  ? ?This is a very pleasant 41 year old who is here for evaluation of umbilical hernia. She reports she has had discomfort and a small bulge above her umbilicus for several years. It will occasionally cause fairly significant sharp discomfort which sometimes takes more than a day to recover from when she is doing some vigorous activity. She reports that it will always reduce and she has had no nausea, vomiting, or obstructive symptoms. She is otherwise healthy without complaints. She has no cardiopulmonary issues. ? ?Review of Systems: ?A complete review of systems was obtained from the patient. I have reviewed this information and discussed as appropriate with the patient. See HPI as well for other ROS. ? ?ROS  ? ?Medical History: ?Past Medical History:  ?Diagnosis Date  ? Anxiety  ? Arthritis  ? Asthma, unspecified asthma severity, unspecified whether complicated, unspecified whether persistent  ? Sleep apnea  ? ?There is no problem list on file for this patient. ? ?History reviewed. No pertinent surgical history.  ? ?No Known Allergies ? ?Current Outpatient Medications on File Prior to Visit  ?Medication Sig Dispense Refill  ? JUNEL 1/20, 21, 1-20 mg-mcg tablet  ? semaglutide (WEGOVY) 1 mg/0.5 mL pen injector 0.5 mL  ? traZODone (DESYREL) 50 MG tablet 1-2 tablets at bedtime as needed  ? ?No current facility-administered medications on file prior to visit.  ? ?Family History  ?Problem Relation Age of Onset  ? High blood pressure (Hypertension) Mother  ? Obesity Mother  ? Hyperlipidemia (Elevated cholesterol) Father  ? High blood pressure (Hypertension) Sister  ? Breast cancer Sister  ? ? ?Social  History  ? ?Tobacco Use  ?Smoking Status Never  ?Smokeless Tobacco Never  ? ? ?Social History  ? ?Socioeconomic History  ? Marital status: Single  ?Tobacco Use  ? Smoking status: Never  ? Smokeless tobacco: Never  ?Substance and Sexual Activity  ? Alcohol use: Never  ? Drug use: Never  ? ?Objective:  ? ?Vitals:  ? ?BP: 120/76  ?Pulse: 82  ?Temp: 36.6 ?C (97.8 ?F)  ?Weight: (!) 108.4 kg (239 lb)  ?Height: 162.6 cm (5\' 4" )  ? ?Body mass index is 41.02 kg/m?. ? ?Physical Exam  ? ?She appears well on exam ? ?Abdomen is soft. ?There is an easily reducible hernia just above the umbilicus. She has a well-healed incision below the umbilicus. The fascial defect is approximately 1 cm ?There is no hepatomegaly ? ?Assessment and Plan:  ? ?Diagnoses and all orders for this visit: ? ?Umbilical hernia without obstruction and without gangrene ? ? ? ?At this point we had a long discussion regarding hernias and abdominal wall anatomy. We discussed the reasons to repair hernias. As her hernia is becoming increasingly symptomatic repair is recommended. We discussed hernia repair with mesh. I discussed both the open and laparoscopic techniques. As this is a small hernia, I would recommend an open repair with mesh as an outpatient. I discussed the surgical procedure in detail. We discussed the risks which includes but is not limited to bleeding, infection, injury to surrounding structures, the use of mesh, hernia recurrence, cardiopulmonary issues, postoperative recovery, etc. She understands and wishes to proceed with surgery which will  be scheduled.  ?

## 2021-10-23 ENCOUNTER — Ambulatory Visit (HOSPITAL_BASED_OUTPATIENT_CLINIC_OR_DEPARTMENT_OTHER): Payer: No Typology Code available for payment source | Admitting: Anesthesiology

## 2021-10-23 ENCOUNTER — Ambulatory Visit (HOSPITAL_BASED_OUTPATIENT_CLINIC_OR_DEPARTMENT_OTHER)
Admission: RE | Admit: 2021-10-23 | Discharge: 2021-10-23 | Disposition: A | Discharge status: Home / Self Care | Payer: No Typology Code available for payment source | Attending: Surgery | Admitting: Surgery

## 2021-10-23 ENCOUNTER — Encounter (HOSPITAL_BASED_OUTPATIENT_CLINIC_OR_DEPARTMENT_OTHER)
Admission: RE | Disposition: A | Discharge status: Home / Self Care | Payer: Self-pay | Source: Home / Self Care | Attending: Surgery

## 2021-10-23 ENCOUNTER — Other Ambulatory Visit: Payer: Self-pay

## 2021-10-23 ENCOUNTER — Encounter (HOSPITAL_BASED_OUTPATIENT_CLINIC_OR_DEPARTMENT_OTHER): Payer: Self-pay | Admitting: Surgery

## 2021-10-23 DIAGNOSIS — K429 Umbilical hernia without obstruction or gangrene: Secondary | ICD-10-CM | POA: Insufficient documentation

## 2021-10-23 DIAGNOSIS — G4730 Sleep apnea, unspecified: Secondary | ICD-10-CM

## 2021-10-23 DIAGNOSIS — Z6841 Body Mass Index (BMI) 40.0 and over, adult: Secondary | ICD-10-CM | POA: Insufficient documentation

## 2021-10-23 HISTORY — DX: Unspecified asthma, uncomplicated: J45.909

## 2021-10-23 HISTORY — DX: Anxiety disorder, unspecified: F41.9

## 2021-10-23 HISTORY — DX: Unspecified osteoarthritis, unspecified site: M19.90

## 2021-10-23 HISTORY — DX: Sleep apnea, unspecified: G47.30

## 2021-10-23 HISTORY — PX: UMBILICAL HERNIA REPAIR: SHX196

## 2021-10-23 LAB — POCT PREGNANCY, URINE: Preg Test, Ur: NEGATIVE

## 2021-10-23 SURGERY — REPAIR, HERNIA, UMBILICAL, ADULT
Anesthesia: General | Site: Abdomen

## 2021-10-23 MED ORDER — AMISULPRIDE (ANTIEMETIC) 5 MG/2ML IV SOLN: 10.0000 mg | Freq: Once | INTRAVENOUS | Status: DC | PRN

## 2021-10-23 MED ORDER — LACTATED RINGERS IV SOLN: INTRAVENOUS | Status: DC

## 2021-10-23 MED ORDER — BUPIVACAINE-EPINEPHRINE 0.5% -1:200000 IJ SOLN
INTRAMUSCULAR | Status: DC | PRN
  Administered 2021-10-23: 20 mL

## 2021-10-23 MED ORDER — FENTANYL CITRATE (PF) 100 MCG/2ML IJ SOLN
INTRAMUSCULAR | Status: DC | PRN
Start: 2021-10-23 — End: 2021-10-23
  Administered 2021-10-23 (×3): 50 ug via INTRAVENOUS

## 2021-10-23 MED ORDER — DEXAMETHASONE SODIUM PHOSPHATE 10 MG/ML IJ SOLN
INTRAMUSCULAR | Status: AC
  Filled 2021-10-23: qty 1

## 2021-10-23 MED ORDER — ONDANSETRON HCL 4 MG/2ML IJ SOLN
INTRAMUSCULAR | Status: DC | PRN
  Administered 2021-10-23: 4 mg via INTRAVENOUS

## 2021-10-23 MED ORDER — ONDANSETRON HCL 4 MG/2ML IJ SOLN
INTRAMUSCULAR | Status: AC
  Filled 2021-10-23: qty 2

## 2021-10-23 MED ORDER — CHLORHEXIDINE GLUCONATE CLOTH 2 % EX PADS: 6.0000 | MEDICATED_PAD | Freq: Once | CUTANEOUS | Status: DC

## 2021-10-23 MED ORDER — FENTANYL CITRATE (PF) 100 MCG/2ML IJ SOLN
INTRAMUSCULAR | Status: AC
  Filled 2021-10-23: qty 2

## 2021-10-23 MED ORDER — LIDOCAINE 2% (20 MG/ML) 5 ML SYRINGE
INTRAMUSCULAR | Status: AC
  Filled 2021-10-23: qty 5

## 2021-10-23 MED ORDER — MIDAZOLAM HCL 2 MG/2ML IJ SOLN
INTRAMUSCULAR | Status: AC
  Filled 2021-10-23: qty 2

## 2021-10-23 MED ORDER — ACETAMINOPHEN 500 MG PO TABS
1000.0000 mg | ORAL_TABLET | ORAL | Status: AC
  Administered 2021-10-23: 1000 mg via ORAL

## 2021-10-23 MED ORDER — PROPOFOL 10 MG/ML IV BOLUS
INTRAVENOUS | Status: DC | PRN
  Administered 2021-10-23: 200 mg via INTRAVENOUS

## 2021-10-23 MED ORDER — DEXAMETHASONE SODIUM PHOSPHATE 4 MG/ML IJ SOLN
INTRAMUSCULAR | Status: DC | PRN
  Administered 2021-10-23: 5 mg via INTRAVENOUS

## 2021-10-23 MED ORDER — MIDAZOLAM HCL 5 MG/5ML IJ SOLN
INTRAMUSCULAR | Status: DC | PRN
  Administered 2021-10-23: 2 mg via INTRAVENOUS

## 2021-10-23 MED ORDER — LIDOCAINE HCL (CARDIAC) PF 100 MG/5ML IV SOSY
PREFILLED_SYRINGE | INTRAVENOUS | Status: DC | PRN
  Administered 2021-10-23: 60 mg via INTRAVENOUS

## 2021-10-23 MED ORDER — OXYCODONE HCL 5 MG/5ML PO SOLN: 5.0000 mg | Freq: Once | ORAL | Status: DC | PRN

## 2021-10-23 MED ORDER — SUCCINYLCHOLINE CHLORIDE 200 MG/10ML IV SOSY
PREFILLED_SYRINGE | INTRAVENOUS | Status: AC
  Filled 2021-10-23: qty 10

## 2021-10-23 MED ORDER — KETOROLAC TROMETHAMINE 30 MG/ML IJ SOLN
30.0000 mg | Freq: Once | INTRAMUSCULAR | Status: AC
Start: 2021-10-23 — End: 2021-10-23
  Administered 2021-10-23: 30 mg via INTRAVENOUS

## 2021-10-23 MED ORDER — HEPARIN (PORCINE) IN NACL 1000-0.9 UT/500ML-% IV SOLN
INTRAVENOUS | Status: AC
  Filled 2021-10-23: qty 500

## 2021-10-23 MED ORDER — EPHEDRINE 5 MG/ML INJ
INTRAVENOUS | Status: AC
  Filled 2021-10-23: qty 5

## 2021-10-23 MED ORDER — CEFAZOLIN SODIUM-DEXTROSE 2-4 GM/100ML-% IV SOLN
INTRAVENOUS | Status: AC
  Filled 2021-10-23: qty 100

## 2021-10-23 MED ORDER — OXYCODONE HCL 5 MG PO TABS: 5.0000 mg | ORAL_TABLET | Freq: Once | ORAL | Status: DC | PRN

## 2021-10-23 MED ORDER — KETOROLAC TROMETHAMINE 30 MG/ML IJ SOLN
INTRAMUSCULAR | Status: AC
  Filled 2021-10-23: qty 1

## 2021-10-23 MED ORDER — CEFAZOLIN SODIUM-DEXTROSE 2-4 GM/100ML-% IV SOLN
2.0000 g | INTRAVENOUS | Status: AC
  Administered 2021-10-23: 2 g via INTRAVENOUS

## 2021-10-23 MED ORDER — ACETAMINOPHEN 500 MG PO TABS
ORAL_TABLET | ORAL | Status: AC
  Filled 2021-10-23: qty 2

## 2021-10-23 MED ORDER — HEPARIN SOD (PORK) LOCK FLUSH 100 UNIT/ML IV SOLN
INTRAVENOUS | Status: AC
  Filled 2021-10-23: qty 5

## 2021-10-23 MED ORDER — ATROPINE SULFATE 0.4 MG/ML IV SOLN
INTRAVENOUS | Status: AC
  Filled 2021-10-23: qty 1

## 2021-10-23 MED ORDER — FENTANYL CITRATE (PF) 100 MCG/2ML IJ SOLN
25.0000 ug | INTRAMUSCULAR | Status: DC | PRN
  Administered 2021-10-23 (×2): 50 ug via INTRAVENOUS

## 2021-10-23 MED ORDER — PHENYLEPHRINE 80 MCG/ML (10ML) SYRINGE FOR IV PUSH (FOR BLOOD PRESSURE SUPPORT)
PREFILLED_SYRINGE | INTRAVENOUS | Status: AC
  Filled 2021-10-23: qty 10

## 2021-10-23 SURGICAL SUPPLY — 42 items
ADH SKN CLS APL DERMABOND .7 (GAUZE/BANDAGES/DRESSINGS) ×1
APL PRP STRL LF DISP 70% ISPRP (MISCELLANEOUS) ×1
BLADE CLIPPER SURG (BLADE) IMPLANT
BLADE SURG 15 STRL LF DISP TIS (BLADE) ×2 IMPLANT
BLADE SURG 15 STRL SS (BLADE) ×2
CANISTER SUCT 1200ML W/VALVE (MISCELLANEOUS) IMPLANT
CHLORAPREP W/TINT 26 (MISCELLANEOUS) ×3 IMPLANT
COVER BACK TABLE 60X90IN (DRAPES) ×3 IMPLANT
COVER MAYO STAND STRL (DRAPES) ×3 IMPLANT
DERMABOND ADVANCED (GAUZE/BANDAGES/DRESSINGS) ×1
DERMABOND ADVANCED .7 DNX12 (GAUZE/BANDAGES/DRESSINGS) ×4 IMPLANT
DRAPE LAPAROTOMY 100X72 PEDS (DRAPES) ×3 IMPLANT
DRAPE UTILITY XL STRL (DRAPES) ×3 IMPLANT
DRSG TEGADERM 2-3/8X2-3/4 SM (GAUZE/BANDAGES/DRESSINGS) IMPLANT
ELECT REM PT RETURN 9FT ADLT (ELECTROSURGICAL) ×2
ELECTRODE REM PT RTRN 9FT ADLT (ELECTROSURGICAL) ×2 IMPLANT
GLOVE SURG SIGNA 7.5 PF LTX (GLOVE) ×6 IMPLANT
GOWN STRL REUS W/ TWL LRG LVL3 (GOWN DISPOSABLE) ×2 IMPLANT
GOWN STRL REUS W/ TWL XL LVL3 (GOWN DISPOSABLE) ×2 IMPLANT
GOWN STRL REUS W/TWL LRG LVL3 (GOWN DISPOSABLE) ×4
GOWN STRL REUS W/TWL XL LVL3 (GOWN DISPOSABLE) ×2
MESH VENTRALEX ST 1-7/10 CRC S (Mesh General) ×1 IMPLANT
NDL HYPO 25X1 1.5 SAFETY (NEEDLE) ×2 IMPLANT
NEEDLE HYPO 25X1 1.5 SAFETY (NEEDLE) ×2 IMPLANT
NS IRRIG 1000ML POUR BTL (IV SOLUTION) IMPLANT
PACK BASIN DAY SURGERY FS (CUSTOM PROCEDURE TRAY) ×3 IMPLANT
PENCIL SMOKE EVACUATOR (MISCELLANEOUS) ×3 IMPLANT
SLEEVE SCD COMPRESS KNEE MED (STOCKING) ×3 IMPLANT
SPIKE FLUID TRANSFER (MISCELLANEOUS) IMPLANT
SPONGE T-LAP 4X18 ~~LOC~~+RFID (SPONGE) ×1 IMPLANT
SUT MNCRL AB 4-0 PS2 18 (SUTURE) ×3 IMPLANT
SUT NOVA 0 T19/GS 22DT (SUTURE) IMPLANT
SUT NOVA NAB DX-16 0-1 5-0 T12 (SUTURE) IMPLANT
SUT NOVA NAB GS-21 1 T12 (SUTURE) IMPLANT
SUT VIC AB 2-0 SH 27 (SUTURE)
SUT VIC AB 2-0 SH 27XBRD (SUTURE) IMPLANT
SUT VIC AB 3-0 SH 27 (SUTURE) ×2
SUT VIC AB 3-0 SH 27X BRD (SUTURE) ×2 IMPLANT
SYR CONTROL 10ML LL (SYRINGE) ×3 IMPLANT
TOWEL GREEN STERILE FF (TOWEL DISPOSABLE) ×3 IMPLANT
TUBE CONNECTING 20X1/4 (TUBING) ×1 IMPLANT
YANKAUER SUCT BULB TIP NO VENT (SUCTIONS) ×1 IMPLANT

## 2021-10-23 NOTE — Anesthesia Postprocedure Evaluation (Signed)
Anesthesia Post Note ? ?Patient: Tara Branch ? ?Procedure(s) Performed: OPEN UMBILICAL HERNIA REPAIR WITH MESH (Abdomen) ? ?  ? ?Patient location during evaluation: PACU ?Anesthesia Type: General ?Level of consciousness: awake and alert ?Pain management: pain level controlled ?Vital Signs Assessment: post-procedure vital signs reviewed and stable ?Respiratory status: spontaneous breathing, nonlabored ventilation, respiratory function stable and patient connected to nasal cannula oxygen ?Cardiovascular status: blood pressure returned to baseline and stable ?Postop Assessment: no apparent nausea or vomiting ?Anesthetic complications: no ? ? ?No notable events documented. ? ?Last Vitals:  ?Vitals:  ? 10/23/21 1100 10/23/21 1115  ?BP: 125/72 124/70  ?Pulse: 93 90  ?Resp: 14 10  ?Temp:    ?SpO2: 93% 94%  ?  ?Last Pain:  ?Vitals:  ? 10/23/21 1151  ?TempSrc:   ?PainSc: 3   ? ? ?  ?  ?  ?  ?  ?  ? ?Suzette Battiest E ? ? ? ? ?

## 2021-10-23 NOTE — Op Note (Signed)
OPEN UMBILICAL HERNIA REPAIR WITH MESH  Procedure Note ? ?Lowry Bowl ?10/23/2021 ? ? ?Pre-op Diagnosis: UMBILICAL HERNIA (1 cm fascial defect) ?    ?Post-op Diagnosis: same ? ? ?Procedure(s): ?OPEN UMBILICAL HERNIA REPAIR WITH MESH (1 cm defect) ? ?Surgeon(s): ?Abigail Miyamoto, MD ?Reine Just, PA-C ? ?Anesthesia: General ? ?Staff:  ?Circulator: Patrice Paradise, RN ?Relief Circulator: McDonough-Hughes, Maceo Pro, RN ?Scrub Person: Ward, Lanetta Inch ? ?Estimated Blood Loss: Minimal ?              ?Findings: The patient was found to have a 1 cm fascial defect above the umbilicus which was repaired with a 4.3 cm round ventral ST patch from [facility] ? ?Procedure: The patient brought to operating identifies correct patient.  She is placed upon the operating table general anesthesia was induced.  Her abdomen was prepped and draped in the usual sterile fashion.  I anesthetized the skin above the umbilicus with Marcaine.  I then made incision with a scalpel and carried this down to the fascial defect.  I opened up the tissue of the fascial defect.  There were no contents up in the hernia sac.  The sac was excised.  I was able to palpate the surrounding peritoneum and at the umbilicus and found no other hernia defects.  A 4.3 round ventral Prolene patch from [facility] was brought to the field.  I placed it through the fascial opening and then pulled it up against the peritoneum with the ties.  The mesh was sewn in place circumferentially with 0 Novafil sutures.  I then cut the ties and closed the fascia over the top of the mesh with a figure-of-eight 0 Vicryl suture.  Wide coverage of the fascial defect appeared to be achieved.  I anesthetized the fascia further with Marcaine.  Hemostasis peer to be achieved.  We then closed the subcutaneous tissue with interrupted 3-0 Vicryl sutures and closed skin with a running 4-0 Monocryl.  Dermabond was then applied.  The patient tolerated the procedure well.  All the counts were  correct at the end of the procedure.  The patient was then extubated in the operating room and taken in stable condition to the recovery room. ?        ? ?Abigail Miyamoto  ? ?Date: 10/23/2021  Time: 10:09 AM ? ? ? ?

## 2021-10-23 NOTE — Anesthesia Procedure Notes (Signed)
Procedure Name: LMA Insertion ?Date/Time: 10/23/2021 9:44 AM ?Performed by: Willa Frater, CRNA ?Pre-anesthesia Checklist: Patient identified, Emergency Drugs available, Suction available and Patient being monitored ?Patient Re-evaluated:Patient Re-evaluated prior to induction ?Oxygen Delivery Method: Circle system utilized ?Preoxygenation: Pre-oxygenation with 100% oxygen ?Induction Type: IV induction ?Ventilation: Mask ventilation without difficulty ?LMA: LMA inserted ?LMA Size: 4.0 ?Number of attempts: 1 ?Airway Equipment and Method: Bite block ?Placement Confirmation: positive ETCO2 ?Tube secured with: Tape ?Dental Injury: Teeth and Oropharynx as per pre-operative assessment  ? ? ? ? ?

## 2021-10-23 NOTE — Transfer of Care (Signed)
Immediate Anesthesia Transfer of Care Note ? ?Patient: Tara Branch ? ?Procedure(s) Performed: OPEN UMBILICAL HERNIA REPAIR WITH MESH (Abdomen) ? ?Patient Location: PACU ? ?Anesthesia Type:General ? ?Level of Consciousness: sedated ? ?Airway & Oxygen Therapy: Patient Spontanous Breathing and Patient connected to face mask oxygen ? ?Post-op Assessment: Report given to RN and Post -op Vital signs reviewed and stable ? ?Post vital signs: Reviewed and stable ? ?Last Vitals:  ?Vitals Value Taken Time  ?BP    ?Temp    ?Pulse    ?Resp    ?SpO2    ? ? ?Last Pain:  ?Vitals:  ? 10/23/21 0800  ?TempSrc: Oral  ?PainSc: 0-No pain  ?   ? ?Patients Stated Pain Goal: 3 (10/23/21 0800) ? ?Complications: No notable events documented. ?

## 2021-10-23 NOTE — Interval H&P Note (Signed)
History and Physical Interval Note: no change in H and P ? ?10/23/2021 ?8:07 AM ? ?Tara Branch  has presented today for surgery, with the diagnosis of UMBILICAL HERNIA.  The various methods of treatment have been discussed with the patient and family. After consideration of risks, benefits and other options for treatment, the patient has consented to  Procedure(s) with comments: ?OPEN UMBILICAL HERNIA REPAIR WITH MESH (N/A) - LMA as a surgical intervention.  The patient's history has been reviewed, patient examined, no change in status, stable for surgery.  I have reviewed the patient's chart and labs.  Questions were answered to the patient's satisfaction.   ? ? ?Coralie Keens ? ? ?

## 2021-10-23 NOTE — Discharge Instructions (Addendum)
CC[facility] _______[facility] ? ?UMBILICAL OR INGUINAL HERNIA REPAIR: POST OP INSTRUCTIONS ? ?Always review your discharge instruction sheet given to you by the facility where your surgery was performed. ?IF YOU HAVE DISABILITY OR FAMILY LEAVE FORMS, YOU MUST BRING THEM TO THE OFFICE FOR PROCESSING.   ?DO NOT GIVE THEM TO YOUR DOCTOR. ? ?1. A  prescription for pain medication may be given to you upon discharge.  Take your pain medication as prescribed, if needed.  If narcotic pain medicine is not needed, then you may take acetaminophen (Tylenol) or ibuprofen (Advil) as needed. ?2. Take your usually prescribed medications unless otherwise directed. ?If you need a refill on your pain medication, please contact your pharmacy.  They will contact our office to request authorization. Prescriptions will not be filled after 5 pm or on week-ends. ?3. You should follow a light diet the first 24 hours after arrival home, such as soup and crackers, etc.  Be sure to include lots of fluids daily.  Resume your normal diet the day after surgery. ?4.Most patients will experience some swelling and bruising around the umbilicus or in the groin and scrotum.  Ice packs and reclining will help.  Swelling and bruising can take several days to resolve.  ?6. It is common to experience some constipation if taking pain medication after surgery.  Increasing fluid intake and taking a stool softener (such as Colace) will usually help or prevent this problem from occurring.  A mild laxative (Milk of Magnesia or Miralax) should be taken according to package directions if there are no bowel movements after 48 hours. ?7. Unless discharge instructions indicate otherwise, you may remove your bandages 24-48 hours after surgery, and you may shower at that time.  You may have steri-strips (small skin tapes) in place directly over the incision.  These strips should be left on the skin for 7-10 days.  If your surgeon used skin glue on the  incision, you may shower in 24 hours.  The glue will flake off over the next 2-3 weeks.  Any sutures or staples will be removed at the office during your follow-up visit. ?8. ACTIVITIES:  You may resume regular (light) daily activities beginning the next day--such as daily self-care, walking, climbing stairs--gradually increasing activities as tolerated.  You may have sexual intercourse when it is comfortable.  Refrain from any heavy lifting or straining until approved by your doctor. ? ?a.You may drive when you are no longer taking prescription pain medication, you can comfortably wear a seatbelt, and you can safely maneuver your car and apply brakes. ?b.RETURN TO WORK:   ?_____________________________________________ ? ?9.You should see your doctor in the office for a follow-up appointment approximately 2-3 weeks after your surgery.  Make sure that you call for this appointment within a day or two after you arrive home to insure a convenient appointment time. ?10.OTHER INSTRUCTIONS: _YOU MAY SHOWER STARTING TOMORROW ?ICE PACK, TYLENOL, AND IBUPROFEN ALSO FOR PAIN ?NO LIFTING MORE THAN 15 TO 20 POUNDS FOR 4 WEEKS ?________________________ ?   _____________________________________ ? ?WHEN TO CALL YOUR DOCTOR: ?Fever over 101.0 ?Inability to urinate ?Nausea and/or vomiting ?Extreme swelling or bruising ?Continued bleeding from incision. ?Increased pain, redness, or drainage from the incision ? ?The clinic staff is available to answer your questions during regular business hours.  Please don?t hesitate to call and ask to speak to one of the nurses for clinical concerns.  If you have a medical emergency, go to the nearest emergency room or  call 911.  A surgeon from [facility] is always on call at the hospital ? ? ?[address], [place], [place]  [address] ? ? [address] [address], [place], [place]   [address] ?[phone] ? [phone] ? FAX [phone] ?Web site:  [email or web address]  ? ? ?Post Anesthesia Home Care Instructions ? ?Activity: ?Get plenty of rest for the remainder of the day. A responsible individual must stay with you for 24 hours following the procedure.  ?For the next 24 hours, DO NOT: ?-Drive a car ?-Advertising copywriter ?-Drink alcoholic beverages ?-Take any medication unless instructed by your physician ?-Make any legal decisions or sign important papers. ? ?Meals: ?Start with liquid foods such as gelatin or soup. Progress to regular foods as tolerated. Avoid greasy, spicy, heavy foods. If nausea and/or vomiting occur, drink only clear liquids until the nausea and/or vomiting subsides. Call your physician if vomiting continues. ? ?Special Instructions/Symptoms: ?Your throat may feel dry or sore from the anesthesia or the breathing tube placed in your throat during surgery. If this causes discomfort, gargle with warm salt water. The discomfort should disappear within 24 hours. ? ?If you had a scopolamine patch placed behind your ear for the management of post- operative nausea and/or vomiting: ? ?1. The medication in the patch is effective for 72 hours, after which it should be removed.  Wrap patch in a tissue and discard in the trash. Wash hands thoroughly with soap and water. ?2. You may remove the patch earlier than 72 hours if you experience unpleasant side effects which may include dry mouth, dizziness or visual disturbances. ?3. Avoid touching the patch. Wash your hands with soap and water after contact with the patch. ?    ?No tylenol until after 2pm if needed today. ?No ibuprofen until after 5pm today. ?

## 2021-10-23 NOTE — Anesthesia Preprocedure Evaluation (Signed)
Anesthesia Evaluation  ?Patient identified by MRN, date of birth, ID band ?Patient awake ? ? ? ?Reviewed: ?Allergy & Precautions, NPO status , Patient's Chart, lab work & pertinent test results ? ?Airway ?Mallampati: II ? ?TM Distance: >3 FB ? ? ? ? Dental ? ?(+) Dental Advisory Given ?  ?Pulmonary ?asthma , sleep apnea ,  ?  ?breath sounds clear to auscultation ? ? ? ? ? ? Cardiovascular ?negative cardio ROS ? ? ?Rhythm:Regular Rate:Normal ? ? ?  ?Neuro/Psych ? Headaches,  Neuromuscular disease   ? GI/Hepatic ?negative GI ROS, Neg liver ROS,   ?Endo/Other  ?Morbid obesity ? Renal/GU ?negative Renal ROS  ? ?  ?Musculoskeletal ? ?(+) Arthritis ,  ? Abdominal ?  ?Peds ? Hematology ?negative hematology ROS ?(+)   ?Anesthesia Other Findings ? ? Reproductive/Obstetrics ? ?  ? ? ? ? ? ? ? ? ? ? ? ? ? ?  ?  ? ? ? ? ? ? ? ? ?Anesthesia Physical ?Anesthesia Plan ? ?ASA: 3 ? ?Anesthesia Plan: General  ? ?Post-op Pain Management: Tylenol PO (pre-op)* and Toradol IV (intra-op)*  ? ?Induction: Intravenous ? ?PONV Risk Score and Plan: 3 and Dexamethasone, Ondansetron, Midazolam and Treatment may vary due to age or medical condition ? ?Airway Management Planned: LMA ? ?Additional Equipment: None ? ?Intra-op Plan:  ? ?Post-operative Plan: Extubation in OR ? ?Informed Consent: I have reviewed the patients History and Physical, chart, labs and discussed the procedure including the risks, benefits and alternatives for the proposed anesthesia with the patient or authorized representative who has indicated his/her understanding and acceptance.  ? ? ? ?Dental advisory given ? ?Plan Discussed with: CRNA ? ?Anesthesia Plan Comments:   ? ? ? ? ? ? ?Anesthesia Quick Evaluation ? ?

## 2021-10-24 ENCOUNTER — Encounter (HOSPITAL_BASED_OUTPATIENT_CLINIC_OR_DEPARTMENT_OTHER): Payer: Self-pay | Admitting: Surgery

## 2022-03-03 LAB — LAB REPORT - SCANNED: EGFR: 84.3

## 2022-11-19 ENCOUNTER — Ambulatory Visit: Payer: Medicaid Other | Admitting: Student

## 2022-11-19 ENCOUNTER — Encounter: Payer: Self-pay | Admitting: Student

## 2022-11-19 VITALS — BP 132/69 | HR 88 | Ht 64.0 in | Wt 235.5 lb

## 2022-11-19 DIAGNOSIS — G479 Sleep disorder, unspecified: Secondary | ICD-10-CM

## 2022-11-19 DIAGNOSIS — N938 Other specified abnormal uterine and vaginal bleeding: Secondary | ICD-10-CM

## 2022-11-19 DIAGNOSIS — M545 Low back pain, unspecified: Secondary | ICD-10-CM

## 2022-11-19 DIAGNOSIS — Z6841 Body Mass Index (BMI) 40.0 and over, adult: Secondary | ICD-10-CM

## 2022-11-19 DIAGNOSIS — G8929 Other chronic pain: Secondary | ICD-10-CM | POA: Insufficient documentation

## 2022-11-19 DIAGNOSIS — Z131 Encounter for screening for diabetes mellitus: Secondary | ICD-10-CM

## 2022-11-19 DIAGNOSIS — Z9189 Other specified personal risk factors, not elsewhere classified: Secondary | ICD-10-CM | POA: Diagnosis not present

## 2022-11-19 DIAGNOSIS — Z7689 Persons encountering health services in other specified circumstances: Secondary | ICD-10-CM | POA: Insufficient documentation

## 2022-11-19 NOTE — Assessment & Plan Note (Signed)
BMI 40.42 today. Patient is trying to lose weight with changing eating habits and exercise. Was on Wegovy but has been off of it for several months since insurance no longer covers it. Pending obtaining medical records from prior PCP. Patient unsure of hx of prediabetes or diabetes. Will check A1c today. She would benefit with referral to [facility]. Counseled and encouraged dietary modifications and incorporating physical activity throughout the week.   Plan -A1c today -referral to [facility]

## 2022-11-19 NOTE — Assessment & Plan Note (Signed)
Patient reports difficulty sleeping and takes melatonin and Trazodone to help. She states taking Trazodone 100 mg at bedtime which will need to be confirmed by pharmacy and prior medical records. Pending getting medical records from prior PCP.

## 2022-11-19 NOTE — Patient Instructions (Addendum)
Thank you, Ms.Keylan Mecham for allowing [place] to provide your care today. Welcome to the [facility] Medicine Center! We are excited to be your primary care doctors.  -Discussed obesity, continue working on healthy eating habits and exercise. Referral sent for [facility] today. -Referral to re-establish with OBGYN to continue following your irregular menstrual cycles.  -Referral to assess for sleep apnea sent today to evaluate for sleep study.    -Will check on the Trazodone with your pharmacy.  -Will do blood work today to check for diabetes.  -Thank you for completing the medical record release form today.   I have ordered the following labs for you: Lab Orders         Hemoglobin A1c      Referrals ordered today:  Referral Orders         Ambulatory referral to Obstetrics / Gynecology         Amb Ref to Medical Weight Management         Ambulatory referral to Neurology      I have ordered the following medication/changed the following medications:   Stop the following medications: There are no discontinued medications.   Start the following medications: No orders of the defined types were placed in this encounter.    Follow up: 2-3 months   Should you have any questions or concerns please call the internal medicine clinic at [phone].    Rana Snare, D.O. [facility]

## 2022-11-19 NOTE — Assessment & Plan Note (Signed)
Presents today to establish primary care. She was seen by [facility] about 6-7 months ago but stated she was dismissed. She is unclear of reason. Was seen at [facility] prior to that until her insurance changed. Will obtain medical records from both offices. Patient signed release forms today.

## 2022-11-19 NOTE — Assessment & Plan Note (Signed)
BMI 40.42. Her STOP BANG score is intermediate. Endorses loud snoring, daytime drowsiness and fatigue. Would benefit from evaluation for sleep apnea. Referral to neurology ([facility]) placed today for sleep study evaluation.

## 2022-11-19 NOTE — Progress Notes (Signed)
CC: establish care  HPI:  [name] is a 42 y.o. female living with a history stated below and presents today for establish care. Please see problem based assessment and plan for additional details.  PMH -migraines -anxiety -chronic low back pain -irregular menstrual cycles -seasonal allergies -obesity  PSH Past Surgical History:  Procedure Laterality Date   CARPAL TUNNEL RELEASE Right 07/11/2013   Procedure: RIGHT CARPAL TUNNEL RELEASE;  Surgeon: Tami Ribas, MD;  Location: [facility];  Service: Orthopedics;  Laterality: Right;   CARPAL TUNNEL RELEASE Left 10/28/2013   Procedure: LEFT CARPAL TUNNEL RELEASE;  Surgeon: Tami Ribas, MD;  Location: [facility];  Service: Orthopedics;  Laterality: Left;   CESAREAN SECTION  12/12/2003   TUBAL LIGATION  05/10/2006   UMBILICAL HERNIA REPAIR N/A 10/23/2021   Procedure: OPEN UMBILICAL HERNIA REPAIR WITH MESH;  Surgeon: Abigail Miyamoto, MD;  Location: [facility];  Service: General;  Laterality: N/A;  LMA   WISDOM TOOTH EXTRACTION     Meds: Current Outpatient Medications on File Prior to Visit  Medication Sig Dispense Refill   dimenhyDRINATE ([facility] MOTION SICKNESS PO) Take by mouth.     ibuprofen (ADVIL,MOTRIN) 200 MG tablet Take 200 mg by mouth every 6 (six) hours as needed.     norethindrone-ethinyl estradiol-FE (LOESTRIN FE) 1-20 MG-MCG tablet Take 1 tablet by mouth daily.     Semaglutide-Weight Management (WEGOVY) 1 MG/0.5ML SOAJ Inject 1 mg into the skin.     traZODone (DESYREL) 50 MG tablet Take 50 mg by mouth at bedtime.     No current facility-administered medications on file prior to visit.   Allergies:  Allergies  Allergen Reactions   Other Itching and Swelling    All fruit. Iceburg lettuce. Cucumbers.   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) -denies tobacco use -endorses occasional EtOH use -denies illicit drug use Social History   Socioeconomic History   Marital status: Single    Spouse name: Not on file   Number of children: 2   Years of education: Currently 2nd yr college   Highest education level: Not on file  Occupational History   Occupation: call center   Occupation: student  Tobacco Use   Smoking status: Never   Smokeless tobacco: Never  Substance and Sexual Activity   Alcohol use: No   Drug use: No   Sexual activity: Not Currently    Birth control/protection: Surgical  Other Topics Concern   Not on file  Social History Narrative   Lives at home with mother, father and children.   Left-handed.   No caffeine use.   Social Determinants of Health   Financial Resource Strain: Not on file  Food Insecurity: Not on file  Transportation Needs: Not on file  Physical Activity: Not on file  Stress: Not on file  Social Connections: Not on file  Intimate Partner Violence: Not on file   Review of Systems: ROS negative except for what is noted on the assessment and plan.  Vitals:   11/19/22 1604 11/19/22 1642  BP: (!) 148/84 132/69  Pulse: 96 88  Weight: 235 lb 8 oz (106.8 kg)   Height: 5\' 4"  (1.626 m)    Physical Exam: Constitutional:  well-appearing female sitting in chair comfortably, in no acute distress HENT: normocephalic atraumatic Cardiovascular: regular rate and rhythm Pulmonary/Chest: normal work of breathing on room air, lungs clear to auscultation bilaterally MSK: normal bulk and tone, no LE edema Neurological: alert & oriented x 3 Skin: warm and dry Psych: pleasant mood  Assessment & Plan:   Dysfunctional uterine bleeding Patient reports irregular menstrual cycles and was seeing [facility]. Patient states no longer seeing them due to insurance change. Would like to re-establish with OBGYN. Referral  sent today.   Morbid obesity with BMI of 40.0-44.9, adult (HCC) BMI 40.42 today. Patient is trying to lose weight with changing eating habits and exercise. Was on Wegovy but has been off of it for several months since insurance no longer covers it. Pending obtaining medical records from prior PCP. Patient unsure of hx of prediabetes or diabetes. Will check A1c today. She would benefit with referral to [facility]. Counseled and encouraged dietary modifications and incorporating physical activity throughout the week.   Plan -A1c today -referral to [facility]  At risk for obstructive sleep apnea BMI 40.42. Her STOP BANG score is intermediate. Endorses loud snoring, daytime drowsiness and fatigue. Would benefit from evaluation for sleep apnea. Referral to neurology ([facility]) placed today for sleep study evaluation.   Encounter to establish care Presents today to establish primary care. She was seen by [facility] about 6-7 months ago but stated she was dismissed. She is unclear of reason. Was seen at [facility] prior to that until her insurance changed. Will obtain medical records from both offices. Patient signed release forms today.   Difficulty sleeping Patient reports difficulty sleeping and takes melatonin and Trazodone to help. She states taking Trazodone 100 mg at bedtime which will need to be confirmed by pharmacy and prior medical records. Pending getting medical records from prior PCP.    Patient discussed with Dr. Rollene Fare, D.O. [facility], PGY-1 Phone: [phone] Date 11/19/2022 Time 6:30 PM

## 2022-11-19 NOTE — Assessment & Plan Note (Signed)
Patient reports irregular menstrual cycles and was seeing [facility]. Patient states no longer seeing them due to insurance change. Would like to re-establish with OBGYN. Referral sent today.

## 2022-11-20 ENCOUNTER — Telehealth: Payer: Self-pay

## 2022-11-20 LAB — HEMOGLOBIN A1C
Est. average glucose Bld gHb Est-mCnc: 120 mg/dL
Hgb A1c MFr Bld: 5.8 % — ABNORMAL HIGH (ref 4.8–5.6)

## 2022-11-20 MED ORDER — TRAZODONE HCL 100 MG PO TABS: 100.0000 mg | ORAL_TABLET | Freq: Every evening | ORAL | 2 refills | Status: DC | PRN

## 2022-11-20 NOTE — Telephone Encounter (Signed)
Requesting lab results, please call pt back.  

## 2022-11-20 NOTE — Telephone Encounter (Signed)
Of note, patient has appt with [facility] on 02/26/23.

## 2022-11-20 NOTE — Addendum Note (Signed)
Addended by: Rana Snare on: 11/20/2022 06:18 PM   Modules accepted: Orders

## 2022-11-24 NOTE — Progress Notes (Signed)
Internal Medicine Clinic Attending  Case discussed with Dr. Zheng  At the time of the visit.  We reviewed the resident's history and exam and pertinent patient test results.  I agree with the assessment, diagnosis, and plan of care documented in the resident's note.  

## 2022-12-16 ENCOUNTER — Other Ambulatory Visit: Payer: Self-pay | Admitting: Student

## 2022-12-16 DIAGNOSIS — G479 Sleep disorder, unspecified: Secondary | ICD-10-CM

## 2022-12-29 ENCOUNTER — Telehealth: Payer: Self-pay

## 2022-12-29 MED ORDER — METHOCARBAMOL 750 MG PO TABS: 750.0000 mg | ORAL_TABLET | Freq: Three times a day (TID) | ORAL | 0 refills | Status: AC | PRN

## 2022-12-29 MED ORDER — NAPROXEN 500 MG PO TABS: 500.0000 mg | ORAL_TABLET | Freq: Two times a day (BID) | ORAL | 0 refills | Status: AC

## 2022-12-29 NOTE — Telephone Encounter (Addendum)
Spoke to patient over the phone. Acute low back pain that started on Friday after she moved and lifted heavy items. Pain is sharp at times and tensing. She noticed a knot around left lower paraspinal area. Denies any trauma or injury during move. Endorses some radiculopathy pain in her legs but still able to ambulate. Denies any urinary retention, stool incontinence or saddle paresthesia. Pain has been at times severe and she has tried tylenol, ibuprofen and heat and ice therapy with some relief. Will send prescription for Robaxin 750 mg TID PRN x 14 days and Naproxen 500 mg BID PRN x 14 days to her pharmacy. We can schedule a f/u office visit to reassess in 1-2 weeks.

## 2022-12-29 NOTE — Telephone Encounter (Addendum)
Return pt's call - c/o lower back pain radiating down her legs and stated she usually take Tramadol. Tramadol is not in current med list. Pt stated she's a new pt and this was discussed at the OV. [facility]. Thanks

## 2022-12-29 NOTE — Telephone Encounter (Signed)
Pt is requesting a call back... she is  requesting tramadol but it is not on her med list

## 2022-12-31 ENCOUNTER — Encounter (INDEPENDENT_AMBULATORY_CARE_PROVIDER_SITE_OTHER): Payer: Self-pay

## 2022-12-31 ENCOUNTER — Encounter (INDEPENDENT_AMBULATORY_CARE_PROVIDER_SITE_OTHER): Payer: Medicaid Other | Admitting: Family Medicine

## 2023-01-12 ENCOUNTER — Encounter: Payer: Self-pay | Admitting: Neurology

## 2023-01-12 ENCOUNTER — Institutional Professional Consult (permissible substitution): Payer: Medicaid Other | Admitting: Neurology

## 2023-01-12 ENCOUNTER — Telehealth: Payer: Self-pay | Admitting: Neurology

## 2023-01-12 NOTE — Telephone Encounter (Signed)
NS for new sleep consult.

## 2023-01-15 ENCOUNTER — Encounter: Payer: Medicaid Other | Admitting: Student

## 2023-01-19 ENCOUNTER — Encounter: Payer: Medicaid Other | Admitting: Student

## 2023-01-27 ENCOUNTER — Ambulatory Visit: Payer: No Typology Code available for payment source | Admitting: Family Medicine

## 2023-02-02 ENCOUNTER — Encounter: Payer: Medicaid Other | Admitting: Student

## 2023-02-04 DIAGNOSIS — R7303 Prediabetes: Secondary | ICD-10-CM | POA: Insufficient documentation

## 2023-02-04 NOTE — Progress Notes (Deleted)
Office: [phone]  /  Fax: [phone]   TeleHealth Visit:  This visit was completed with telemedicine (audio/video) technology. Tara Branch has verbally consented to this TeleHealth visit. The patient is located at home, the provider is located at home. The participants in this visit include the listed provider and patient. The visit was conducted today via MyChart video.  Initial Visit  Tara Branch was seen via virtual visit today to evaluate for treatment of obesity. She is interested in losing weight to improve overall health and reduce the risk of weight related complications. She presents today to review program treatment options, initial physical assessment, and evaluation.     Height: 5\' 4"  Weight: 235 lbs BMI: 40  She was referred by: PCP  When asked what else they would like to accomplish? She states: {EMHopetoaccomplish:28304}  Weight history: Began struggling with weight ***  When asked how has your weight affected you? She states: {EMWeightAffected:28305}  Some associated conditions: Prediabetes  Contributing factors: {EMcontributingfactors:28307}  Weight promoting medications identified: Contraceptives or hormonal therapy  Current nutrition plan: {EMNutritionplan:28309::"None"}  Current level of physical activity: {EMcurrentPA:28310::"None"}  Current or previous pharmacotherapy: {EM previousRx:28311}  Response to medication: {EMResponsetomedication:28312}  Past medical history includes:   Past Medical History:  Diagnosis Date   Anxiety    Arthritis    Asthma    CTS (carpal tunnel syndrome) 10/2013   left   Headache    Irregular periods    LMP 05/2013 - has had BTL   Seasonal allergies    Sleep apnea      Objective:     General:  Alert, oriented and cooperative. Patient is in no acute distress.  Respiratory: Normal respiratory effort, no problems with respiration noted  Mental Status: Normal mood and affect. Normal behavior. Normal judgment  and thought content.    Assessment and Plan:   1. Prediabetes Last A1c was 5.8.  Medication(s): {dwwpharmacotherapy:29109} Polyphagia:{dwwyes:29172} Lab Results  Component Value Date   HGBA1C 5.8 (H) 11/19/2022   No results found for: "INSULIN"  Plan: {dwwmed:29123} {dwwpharmacotherapy:29109}   2. ***  3. Obesity Treatment / Action Plan:  Will complete provided nutritional and psychosocial assessment questionnaire before the next appointment. Will be scheduled for indirect calorimetry to determine resting energy expenditure in a fasting state.  This will allow [place] to create a reduced calorie, high-protein meal plan to promote loss of fat mass while preserving muscle mass. Was counseled on pharmacotherapy and role as an adjunct in weight management.   Obesity Education Performed Today:  We discussed obesity as a disease and the importance of a more detailed evaluation of all the factors contributing to the disease.  We discussed the importance of long term lifestyle changes which include nutrition, exercise and behavioral modifications as well as the importance of customizing this to her specific health and social needs.  We discussed the benefits of reaching a healthier weight to alleviate the symptoms of existing conditions and reduce the risks of the biomechanical, metabolic and psychological effects of obesity.  Tara Branch appears to be in the action stage of change and states they are ready to start intensive lifestyle modifications and behavioral modifications.  ______________________________________________________________________________  She will be contacted by [facility] to set up initial appointment and the first follow up appointment with a physician.   The following office policies were discussed. She voiced understanding: - Patient will be considered late at 6 minutes past appointment time.   - For the first office visit, patient needs to  arrive 1 hour early, fasting except for water. Patient should arrive 15 minutes early for all other visits. -  Patient will bring completed new patient paperwork to first office visit.  If not, appointment will be rescheduled.  30 minutes was spent today on this visit including the above counseling, pre-visit chart review, and post-visit documentation.  Reviewed by clinician on day of visit: allergies, medications, problem list, medical history, surgical history, family history, social history, and previous encounter notes pertinent to obesity diagnosis.    Jesse Sans, FNP

## 2023-02-05 ENCOUNTER — Telehealth (INDEPENDENT_AMBULATORY_CARE_PROVIDER_SITE_OTHER): Payer: Self-pay | Admitting: Family Medicine

## 2023-02-05 ENCOUNTER — Telehealth (INDEPENDENT_AMBULATORY_CARE_PROVIDER_SITE_OTHER): Payer: Medicaid Other | Admitting: Family Medicine

## 2023-02-05 DIAGNOSIS — Z6841 Body Mass Index (BMI) 40.0 and over, adult: Secondary | ICD-10-CM

## 2023-02-05 DIAGNOSIS — R7303 Prediabetes: Secondary | ICD-10-CM

## 2023-02-05 NOTE — Telephone Encounter (Signed)
Patient was a no-show for virtual visit.  Waited on the virtual visit until 6 minutes past appointment time per office policy.  Left VM advising patient to call to reschedule.  

## 2023-02-17 ENCOUNTER — Encounter: Payer: Self-pay | Admitting: Student

## 2023-02-17 ENCOUNTER — Ambulatory Visit: Payer: Medicaid Other | Admitting: Student

## 2023-02-17 ENCOUNTER — Other Ambulatory Visit: Payer: Self-pay

## 2023-02-17 VITALS — BP 130/79 | HR 98 | Temp 97.8°F | Ht 64.0 in | Wt 243.1 lb

## 2023-02-17 DIAGNOSIS — M545 Low back pain, unspecified: Secondary | ICD-10-CM

## 2023-02-17 DIAGNOSIS — Z6841 Body Mass Index (BMI) 40.0 and over, adult: Secondary | ICD-10-CM | POA: Diagnosis not present

## 2023-02-17 DIAGNOSIS — Z3041 Encounter for surveillance of contraceptive pills: Secondary | ICD-10-CM | POA: Insufficient documentation

## 2023-02-17 DIAGNOSIS — G8929 Other chronic pain: Secondary | ICD-10-CM | POA: Diagnosis not present

## 2023-02-17 MED ORDER — LIDOCAINE 4 % EX PTCH: 1.0000 | MEDICATED_PATCH | CUTANEOUS | 0 refills | Status: DC

## 2023-02-17 MED ORDER — NORETHIN ACE-ETH ESTRAD-FE 1.5-30 MG-MCG PO TABS: 1.0000 | ORAL_TABLET | Freq: Every day | ORAL | 6 refills | Status: DC

## 2023-02-17 MED ORDER — NORETHIN ACE-ETH ESTRAD-FE 1-20 MG-MCG PO TABS: 1.0000 | ORAL_TABLET | Freq: Every day | ORAL | 6 refills | Status: DC

## 2023-02-17 MED ORDER — GABAPENTIN 300 MG PO CAPS: 300.0000 mg | ORAL_CAPSULE | Freq: Three times a day (TID) | ORAL | 0 refills | Status: DC

## 2023-02-17 MED ORDER — WEGOVY 1 MG/0.5ML ~~LOC~~ SOAJ: 1.0000 mg | SUBCUTANEOUS | 0 refills | Status: DC

## 2023-02-17 NOTE — Assessment & Plan Note (Addendum)
Patient reports that she lost her insurance, and therefore was unable to get [facility].  She states she would like to try [facility] again.  Will retry Wegovy. Patient has tried lifestyle changes and these have been limited because of the pain the the patient is in. She as tried [place] in the past and patient stated that it worked well for her. Should be able to get this again as it has helped her in the past.   Plan: -Send in [place] -Patient educated on diet and exercise

## 2023-02-17 NOTE — Assessment & Plan Note (Signed)
Refilled patient's Jun[place] today.

## 2023-02-17 NOTE — Assessment & Plan Note (Signed)
Patient reports having chronic back pain.  She has been dealing with this for many years.  She reports that nothing has helped her pain.  She describes this to be in her left lumbar region.  Occasionally radiates down her left leg.  She also reports radiation to her neck as well as her arms.  She denies any saddle anesthesia, urinary incontinence, or bladder incontinence.  She does note that she has night sweats occasionally.  She reports it has been limiting her function on a day-to-day basis.  She states she has trouble walking as well.  Per chart review, I see that patient has been seen by [facility], and has been referred to neurosurgery back in 2020-2021.  Patient has been trialed on tizanidine, Robaxin, naproxen, ibuprofen, Tylenol, hydrocodone, tramadol, and even has had injections into her back.  None of this is alleviated her pain.  Patient was referred to neurosurgery, who deemed patient was not a surgical candidate back in 2021.  Patient has had MRI imaging as well consistent with small annular tear at the 6 o'clock position at L4-L5.  Patient also had very mild grade 1 spondylolisthesis causing for minimal stenosis on the left and a possible left L5 radiculopathy.  On exam today, patient has a negative straight leg raise.  Patient does have left lumbar paraspinal tenderness.  No midline spinal tenderness.  I do think that patient should have another evaluation from neurosurgery as patient has had 3-4 years with no further evaluation.  I think this is likely related to radiculopathy or spinal stenosis.  Plan: -Will trial gabapentin 300 mg 3 times daily -Lidocaine patches applied -Patient referred to physical therapy -Referral placed for neurosurgery -Patient to follow-up in 3 months

## 2023-02-17 NOTE — Patient Instructions (Signed)
Mlissa, Zummo you for allowing me to take part in your care today.  Here are your instructions.  1. Regarding your back pain I am writing you for gabapentin, Please take this 3 times a day. It can make you drowsy so please beware of that.   2. I have referred you to Physical therapy and to neurosurgery. Please await for phone call for scheduling.  3. I have sent in for wegovy to see if it can be covered. I sent in for you Junel as well.   4. For you back pain I have also sent in for some lidocaine patches, please apply to your back.  5. Please follow up in 3 months to see how your back pain is doing.  Thank you, Dr. Allena Katz  If you have any other questions please contact the internal medicine clinic at [phone]

## 2023-02-17 NOTE — Progress Notes (Addendum)
CC: Back pain  HPI:  [name] is a 42 y.o. female with past medical history of migraines, prediabetes, low back pain presenting for follow-up on low back pain.  Please see assessment and plan for full HPI.  Medications: Insomnia: Trazodone 100 mg nightly Obesity: Wegovy OCP: [facility] Back pain: Gabapentin 300 mg 3 times daily, lidocaine patches  Recently had telephone encounter with Dr. Sherrilee Gilles for back pain.  Patient was given naproxen, Robaxin.  Past Medical History:  Diagnosis Date   Anxiety    Arthritis    Asthma    CTS (carpal tunnel syndrome) 10/2013   left   Headache    Irregular periods    LMP 05/2013 - has had BTL   Seasonal allergies    Sleep apnea      Current Outpatient Medications:    gabapentin (NEURONTIN) 300 MG capsule, Take 1 capsule (300 mg total) by mouth 3 (three) times daily., Disp: 90 capsule, Rfl: 0   dimenhyDRINATE ([facility] MOTION SICKNESS PO), Take by mouth., Disp: , Rfl:    norethindrone-ethinyl estradiol-iron (LOESTRIN FE) 1.5-30 MG-MCG tablet, Take 1 tablet by mouth daily., Disp: 28 tablet, Rfl: 6   Semaglutide-Weight Management (WEGOVY) 1 MG/0.5ML SOAJ, Inject 1 mg into the skin once a week., Disp: 2 mL, Rfl: 0   traZODone (DESYREL) 100 MG tablet, TAKE 1 TABLET BY MOUTH AT BEDTIME AS NEEDED FOR SLEEP., Disp: 90 tablet, Rfl: 1  Review of Systems:    MSK: Patient endorses back pain  Physical Exam:  Vitals:   02/17/23 1446  BP: 130/79  Pulse: 98  Temp: 97.8 F (36.6 C)  TempSrc: Oral  SpO2: 100%  Weight: 243 lb 1.6 oz (110.3 kg)  Height: 5\' 4"  (1.626 m)    General: Patient is sitting comfortably in the room  Head: Normocephalic, atraumatic  Cardio: Regular rate and rhythm, no murmurs, rubs or gallops Pulmonary: Clear to ausculation bilaterally with no rales, rhonchi, and crackles   Back: No midline tenderness, no step off or deformities noted.  Left lumbar paraspinal muscle tenderness appreciated.  Negative straight leg raise  bilaterally.   Assessment & Plan:   Chronic low back pain Patient reports having chronic back pain.  She has been dealing with this for many years.  She reports that nothing has helped her pain.  She describes this to be in her left lumbar region.  Occasionally radiates down her left leg.  She also reports radiation to her neck as well as her arms.  She denies any saddle anesthesia, urinary incontinence, or bladder incontinence.  She does note that she has night sweats occasionally.  She reports it has been limiting her function on a day-to-day basis.  She states she has trouble walking as well.  Per chart review, I see that patient has been seen by [facility], and has been referred to neurosurgery back in 2020-2021.  Patient has been trialed on tizanidine, Robaxin, naproxen, ibuprofen, Tylenol, hydrocodone, tramadol, and even has had injections into her back.  None of this is alleviated her pain.  Patient was referred to neurosurgery, who deemed patient was not a surgical candidate back in 2021.  Patient has had MRI imaging as well consistent with small annular tear at the 6 o'clock position at L4-L5.  Patient also had very mild grade 1 spondylolisthesis causing for minimal stenosis on the left and a possible left L5 radiculopathy.  On exam today, patient has a negative straight leg raise.  Patient does have left lumbar paraspinal tenderness.  No midline spinal tenderness.  I do think that patient should have another evaluation from neurosurgery as patient has had 3-4 years with no further evaluation.  I think this is likely related to radiculopathy or spinal stenosis.  Plan: -Will trial gabapentin 300 mg 3 times daily -Lidocaine patches applied -Patient referred to physical therapy -Referral placed for neurosurgery -Patient to follow-up in 3 months  Morbid obesity with BMI of 40.0-44.9, adult [facility]) Patient reports that she lost her insurance, and therefore was unable to get [facility].  She states she  would like to try [facility] again.  Will retry Wegovy. Patient has tried lifestyle changes and these have been limited because of the pain the the patient is in. She as tried [place] in the past and patient stated that it worked well for her. Should be able to get this again as it has helped her in the past.   Plan: -Send in Wegovy -Patient educated on diet and exercise  Uses oral contraception Refilled patient's Junel today.   Patient discussed with Dr. Princella Pellegrini, DO PGY-2 Internal Medicine Resident  Pager: [phone]

## 2023-02-18 NOTE — Progress Notes (Signed)
Internal Medicine Clinic Attending  Case discussed with the resident at the time of the visit.  We reviewed the resident's history and exam and pertinent patient test results.  I agree with the assessment, diagnosis, and plan of care documented in the resident's note.  

## 2023-02-26 ENCOUNTER — Ambulatory Visit: Payer: No Typology Code available for payment source | Admitting: Family Medicine

## 2023-02-26 ENCOUNTER — Other Ambulatory Visit: Payer: Self-pay

## 2023-02-26 ENCOUNTER — Other Ambulatory Visit: Payer: Self-pay | Admitting: Student

## 2023-03-02 ENCOUNTER — Other Ambulatory Visit: Payer: Self-pay | Admitting: Student

## 2023-03-02 DIAGNOSIS — Z1231 Encounter for screening mammogram for malignant neoplasm of breast: Secondary | ICD-10-CM

## 2023-03-12 ENCOUNTER — Telehealth: Payer: Self-pay

## 2023-03-12 NOTE — Telephone Encounter (Signed)
Prior Authorization for patient Tara Branch MG/0.5ML) came through on cover my meds was submitted with last office notes attached and labs awaiting approval or denied.  [id number]

## 2023-03-12 NOTE — Telephone Encounter (Signed)
Decision:Denied Tara Branch (Key: [id number]) Rx #: [id number] IONGEX 1MG /0.5ML auto-injectors Form [employer] Electronic Prior Authorization Form Created Message from Plan Denied

## 2023-03-17 ENCOUNTER — Telehealth: Payer: Self-pay

## 2023-03-17 MED ORDER — LIDOCAINE 5 % EX PTCH: 1.0000 | MEDICATED_PATCH | CUTANEOUS | 0 refills | Status: DC

## 2023-03-17 NOTE — Telephone Encounter (Signed)
Sent Rx for lidocaine 5% patch to pharmacy listed.

## 2023-03-17 NOTE — Telephone Encounter (Signed)
Patient called regarding her rx for lidocaine 4% patches, she is stated that her insurance cover the 5% patches. Patient is requesting a new rx to be sent to the pharmacy.  Pharmacy: [id number] - [place], [place] - [address] AT [facility] [address], [place] [address]

## 2023-03-17 NOTE — Addendum Note (Signed)
Addended by: Rana Snare on: 03/17/2023 06:46 PM   Modules accepted: Orders

## 2023-03-18 ENCOUNTER — Telehealth: Payer: Self-pay

## 2023-03-18 NOTE — Telephone Encounter (Signed)
Prior Authorization for patient (Lidocaine 5% patches) came through on cover my meds was submitted with last office notes awaiting approval or denial.  WJX:[id number]

## 2023-03-18 NOTE — Telephone Encounter (Signed)
Decision:Approved Lowry Bowl (KeyLionel December) PA Case ID #: [id number] Rx #: [id number] Need Help? Call us at [phone] Outcome Approved today by [employer] 2017 Approved. LIDOCAINE 5% Patch is approved from 03/18/2023 to 03/17/2024. Authorization Expiration Date: 03/17/2024 Drug Lidocaine 5% patches ePA cloud logo Form [employer] Electronic Prior Authorization Form Original Claim Info 75 Call [phone]. For a 3 day temporary supply, submit DUR PPS Level of Service Code 03.

## 2023-03-19 ENCOUNTER — Ambulatory Visit: Payer: Medicaid Other

## 2023-04-15 ENCOUNTER — Ambulatory Visit
Admission: RE | Admit: 2023-04-15 | Discharge: 2023-04-15 | Disposition: A | Discharge status: Home / Self Care | Payer: Medicaid Other | Source: Ambulatory Visit | Attending: Internal Medicine | Admitting: Internal Medicine

## 2023-04-15 DIAGNOSIS — Z1231 Encounter for screening mammogram for malignant neoplasm of breast: Secondary | ICD-10-CM

## 2023-04-28 ENCOUNTER — Telehealth: Payer: Self-pay

## 2023-04-28 NOTE — Telephone Encounter (Signed)
Patient called regarding the denial for [facility], patient stated she has new insurance and asked if the prior authorization could be resubmitted.  Prior Authorization for patient [facility] 1 MG/0.5 ML) was resubmitted to cover my meds with last office notes awaiting approval or denial.  KEY: [id number]

## 2023-04-29 NOTE — Telephone Encounter (Signed)
Decision:Denied   [employer] reviewed your WEGOVY 1 MG/0.5 ML PEN request for the above-identified  member, and it is denied for the following reason: because we did not see what we need to  approve the drug you asked for, [facility]). We may be able to approve this drug in a certain  situation (when your baseline weight [including whether in pounds or kilograms] and body  mass index [BMI] measured within the past 45 days of this prior authorization request and the  date of these baseline measurements have been provided). We do not see that this applies to  you. We based this decision on your health plan's prior authorization clinical criteria named  GLP1s (glucagon-like peptide 1s) for Weight Management.  Patient is scheduled to come in on 11/21.

## 2023-05-05 ENCOUNTER — Other Ambulatory Visit (HOSPITAL_COMMUNITY): Payer: Self-pay | Admitting: Neurological Surgery

## 2023-05-05 DIAGNOSIS — M5416 Radiculopathy, lumbar region: Secondary | ICD-10-CM

## 2023-05-12 ENCOUNTER — Ambulatory Visit (HOSPITAL_COMMUNITY): Payer: Medicaid Other

## 2023-05-14 ENCOUNTER — Other Ambulatory Visit: Payer: Self-pay

## 2023-05-14 ENCOUNTER — Ambulatory Visit: Payer: Medicaid Other | Admitting: Student

## 2023-05-14 ENCOUNTER — Encounter: Payer: Self-pay | Admitting: Student

## 2023-05-14 VITALS — BP 136/77 | HR 79 | Temp 97.8°F | Ht 64.0 in | Wt 246.9 lb

## 2023-05-14 DIAGNOSIS — R051 Acute cough: Secondary | ICD-10-CM | POA: Diagnosis not present

## 2023-05-14 DIAGNOSIS — R059 Cough, unspecified: Secondary | ICD-10-CM | POA: Insufficient documentation

## 2023-05-14 DIAGNOSIS — M65311 Trigger thumb, right thumb: Secondary | ICD-10-CM | POA: Diagnosis not present

## 2023-05-14 MED ORDER — IBUPROFEN 200 MG PO TABS
800.0000 mg | ORAL_TABLET | Freq: Two times a day (BID) | ORAL | 0 refills | Status: DC | PRN
Start: 2023-05-14 — End: 2023-05-20

## 2023-05-14 MED ORDER — GUAIFENESIN-DM 100-10 MG/5ML PO SYRP: 5.0000 mL | ORAL_SOLUTION | ORAL | 0 refills | Status: DC | PRN

## 2023-05-14 NOTE — Progress Notes (Signed)
CC: right thumb pain  HPI:  Tara Branch is a 42 y.o. female living with a history stated below and presents today for right thumb pain. Please see problem based assessment and plan for additional details.  Past Medical History:  Diagnosis Date   Anxiety    Arthritis    Asthma    CTS (carpal tunnel syndrome) 10/2013   left   Headache    Irregular periods    LMP 05/2013 - has had BTL   Seasonal allergies    Sleep apnea     Current Outpatient Medications on File Prior to Visit  Medication Sig Dispense Refill   dimenhyDRINATE ([facility] MOTION SICKNESS PO) Take by mouth.     gabapentin (NEURONTIN) 300 MG capsule Take 1 capsule (300 mg total) by mouth 3 (three) times daily. 90 capsule 0   lidocaine (LIDODERM) 5 % Place 1 patch onto the skin daily. Remove & Discard patch within 12 hours or as directed by MD 30 patch 0   norethindrone-ethinyl estradiol-iron (LOESTRIN FE) 1.5-30 MG-MCG tablet Take 1 tablet by mouth daily. 28 tablet 6   Semaglutide-Weight Management (WEGOVY) 1 MG/0.5ML SOAJ Inject 1 mg into the skin once a week. 2 mL 0   traZODone (DESYREL) 100 MG tablet TAKE 1 TABLET BY MOUTH AT BEDTIME AS NEEDED FOR SLEEP. 90 tablet 1   No current facility-administered medications on file prior to visit.    Family History  Problem Relation Age of Onset   Hypertension Mother    Arthritis Mother    Headache Mother    Healthy Father    Breast cancer Sister 34   Social History   Socioeconomic History   Marital status: Single    Spouse name: Not on file   Number of children: 2   Years of education: Currently 2nd yr college   Highest education level: Not on file  Occupational History   Occupation: call center   Occupation: student  Tobacco Use   Smoking status: Never   Smokeless tobacco: Never  Substance and Sexual Activity   Alcohol use: No   Drug use: No   Sexual activity: Not Currently    Birth control/protection: Surgical  Other Topics Concern   Not on file   Social History Narrative   Lives at home with mother, father and children.   Left-handed.   No caffeine use.   Social Determinants of Health   Financial Resource Strain: Not on file  Food Insecurity: Not on file  Transportation Needs: Not on file  Physical Activity: Not on file  Stress: Not on file  Social Connections: Not on file  Intimate Partner Violence: Not on file   Review of Systems: ROS negative except for what is noted on the assessment and plan.  Vitals:   05/14/23 1313 05/14/23 1322  BP: (!) 143/76 136/77  Pulse: 83 79  Temp: 97.8 F (36.6 C)   TempSrc: Oral   SpO2: 100%   Weight: 246 lb 14.4 oz (112 kg)   Height: 5\' 4"  (1.626 m)    Physical Exam: Constitutional: well appearing Cardiovascular: regular rate and rhythm, no m/r/g Pulmonary/Chest: CTA bilaterally, normal respiratory rate and effort MSK: tenderness to deep palpation at the inferior portion of the MCP joint of the right thumb; pain with flexion; no evident deformities or bony changes Neurological: no sensory loss over right hand Skin: no erythema or swelling over right thumb joint  Assessment & Plan:   Patient seen with Dr. Criselda Peaches  Trigger finger  of right thumb She reports several weeks of pain and "locking up" of her right thumb MCP joint, which has progressively worsened.  She states that occasionally when she is moving her thumb and will lock up and she has to unlock it by moving it around repeatedly.  She has been taking ibuprofen and has been icing it with minimal relief.  The pain worsens with flexion of the thumb at the MCP joint and is tender to deep palpation.  The pain does not extend down the wrist, so it is less likely tenosynovitis. No bony deformities or swelling/erythema are noted.  I think this is most likely trigger finger.  I recommended a thumb splint, especially at night, for the next 4 to 6 weeks with a follow-up visit to assess her her pain.  I have also represcribed her  ibuprofen.  I expect her symptoms to improve with splint use, but if not, we can explore other options at her next visit.  Cough She states she is on the tail end of recovering from an upper respiratory infection but has had a persistent cough for the past week and a half.  She states she previously had congestion and mucus, but it has progressively improved.  However, her cough has continued, so she would like some cough medication.  She is afebrile and her pulmonary exam was unremarkable, so I have low concern of actively worsening infection at this time.  I suspect this was a viral illness and she should be feeling better within the next week or 2.  In the meantime, I have prescribed Robitussin for her cough.  Annett Fabian, MD  [facility] Internal Medicine, PGY-1 Phone: [phone] Date 05/14/2023 Time 2:10 PM

## 2023-05-14 NOTE — Assessment & Plan Note (Signed)
She reports several weeks of pain and "locking up" of her right thumb MCP joint, which has progressively worsened.  She states that occasionally when she is moving her thumb and will lock up and she has to unlock it by moving it around repeatedly.  She has been taking ibuprofen and has been icing it with minimal relief.  The pain worsens with flexion of the thumb at the MCP joint and is tender to deep palpation.  The pain does not extend down the wrist, so it is less likely tenosynovitis. No bony deformities or swelling/erythema are noted.  I think this is most likely trigger finger.  I recommended a thumb splint, especially at night, for the next 4 to 6 weeks with a follow-up visit to assess her her pain.  I have also represcribed her ibuprofen.  I expect her symptoms to improve with splint use, but if not, we can explore other options at her next visit.

## 2023-05-14 NOTE — Patient Instructions (Signed)
Thank you, [name] for allowing [place] to provide your care today. Today we discussed your right thumb pain and cough.  As we discussed, please purchase a thumb splint at a pharmacy/online retailer and use every night for the next 4-6 weeks. I expect this to improve your symptoms, which are most likely due to trigger finger. I have also prescribed ibuprofen, which you can take up to twice daily as needed. We will schedule a follow up visit in 6 weeks to re-evaluate your thumb pain and review your overall health.   For your cough, I have prescribed Robitussin. You may take by mouth every 4 hours as needed for cough.  I have ordered the following medication/changed the following medications:   Start the following medications: Meds ordered this encounter  Medications   guaiFENesin-dextromethorphan (ROBITUSSIN DM) 100-10 MG/5ML syrup    Sig: Take 5 mLs by mouth every 4 (four) hours as needed for cough.    Dispense:  118 mL    Refill:  0   ibuprofen (ADVIL) 200 MG tablet    Sig: Take 4 tablets (800 mg total) by mouth 2 (two) times daily as needed.    Dispense:  30 tablet    Refill:  0     Follow up: 6 weeks   We look forward to seeing you next time. Please call our clinic at [phone] if you have any questions or concerns. The best time to call is Monday-Friday from 9am-4pm, but there is someone available 24/7. If after hours or the weekend, call the main hospital number and ask for the Internal Medicine Resident On-Call. If you need medication refills, please notify your pharmacy one week in advance and they will send [place] a request.   Thank you for trusting me with your care. Wishing you the best!   Annett Fabian, MD [facility]

## 2023-05-14 NOTE — Assessment & Plan Note (Addendum)
She states she is on the tail end of recovering from an upper respiratory infection but has had a persistent cough for the past week and a half.  She states she previously had congestion and mucus, but it has progressively improved.  However, her cough has continued, so she would like some cough medication.  She is afebrile and her pulmonary exam was unremarkable, so I have low concern of actively worsening infection at this time.  I suspect this was a viral illness and she should be feeling better within the next week or two.  In the meantime, I have prescribed Robitussin for her cough.

## 2023-05-18 ENCOUNTER — Ambulatory Visit (HOSPITAL_COMMUNITY)
Admission: RE | Admit: 2023-05-18 | Discharge: 2023-05-18 | Disposition: A | Discharge status: Home / Self Care | Payer: Medicaid Other | Source: Ambulatory Visit | Attending: Neurological Surgery | Admitting: Neurological Surgery

## 2023-05-18 DIAGNOSIS — M5416 Radiculopathy, lumbar region: Secondary | ICD-10-CM | POA: Diagnosis not present

## 2023-05-18 DIAGNOSIS — M4317 Spondylolisthesis, lumbosacral region: Secondary | ICD-10-CM | POA: Diagnosis not present

## 2023-05-18 DIAGNOSIS — M4726 Other spondylosis with radiculopathy, lumbar region: Secondary | ICD-10-CM | POA: Diagnosis not present

## 2023-05-18 DIAGNOSIS — M5117 Intervertebral disc disorders with radiculopathy, lumbosacral region: Secondary | ICD-10-CM | POA: Diagnosis not present

## 2023-05-18 DIAGNOSIS — M5116 Intervertebral disc disorders with radiculopathy, lumbar region: Secondary | ICD-10-CM | POA: Diagnosis not present

## 2023-05-20 ENCOUNTER — Other Ambulatory Visit: Payer: Self-pay | Admitting: Internal Medicine

## 2023-05-20 ENCOUNTER — Telehealth: Payer: Self-pay

## 2023-05-20 DIAGNOSIS — J40 Bronchitis, not specified as acute or chronic: Secondary | ICD-10-CM | POA: Diagnosis not present

## 2023-05-20 MED ORDER — IBUPROFEN 800 MG PO TABS: 800.0000 mg | ORAL_TABLET | Freq: Two times a day (BID) | ORAL | 0 refills | Status: DC | PRN

## 2023-05-20 NOTE — Progress Notes (Signed)
Dr. Versie Starks sent in rx for patient to take ibuprofen 800 mg BID PRN. Sending in 800 mg tablets, as the 200 mg tablets are not covered by this patient's insurance. Additionally, he prescribed Robitussin for her cough and is requesting an alternative that is covered by insurance, all of the meds I try to order are "not reimbursable" as they are OTC.

## 2023-05-20 NOTE — Telephone Encounter (Signed)
Patient called regarding her rx for ibuprofen, per patient her insurance will not cover 200 mg tablets. Patient stated in the past she has been taking 800 mg tablets which was covered by her insurance. Patient also stated her insurance does not cover the robitussin, she is asking to an alternative medication to be sent in at which her insurance covers.

## 2023-05-20 NOTE — Progress Notes (Signed)
 Internal Medicine Clinic Attending  I was physically present during the key portions of the resident provided service and participated in the medical decision making of patient's management care. I reviewed pertinent patient test results.  The assessment, diagnosis, and plan were formulated together and I agree with the documentation in the resident's note.  Inez Catalina, MD

## 2023-05-25 ENCOUNTER — Other Ambulatory Visit: Payer: Self-pay | Admitting: Student

## 2023-05-25 ENCOUNTER — Telehealth: Payer: Self-pay

## 2023-05-25 MED ORDER — IBUPROFEN 800 MG PO TABS: 800.0000 mg | ORAL_TABLET | Freq: Two times a day (BID) | ORAL | 0 refills | Status: DC | PRN

## 2023-05-25 NOTE — Telephone Encounter (Signed)
Prior Authorization for patient Tara Branch 1MG /0.5ML auto-injectors) came through on cover my meds was submitted with last office notes awaiting approval or denial.  ZOX:[id number]

## 2023-05-25 NOTE — Telephone Encounter (Signed)
I sent her ibuprofen as 800 mg tablets to her pharmacy. For the robitussin, she can go to [facility] and pick it up without a prescription. It should be affordable. I see that she recently went to urgent care and they prescribed her some medications, so she may not need it anymore.

## 2023-05-25 NOTE — Telephone Encounter (Signed)
Decision:Approved Sofie Guinyard (Key: [id number]) Reginal Lutes 1MG /0.5ML auto-injectors Form [employer] [facility] Electronic [place] Form [phone] NCPDP) Created Your prior authorization for Reginal Lutes has been approved! More Info Personalized support and financial assistance may be available through the [employer] program. For more information, and to see program requirements, click on the More Info button to the right.  Message from plan: PA Case: [id number], Status: Approved, Coverage Starts on: 05/25/2023 12:00:00 AM, Coverage Ends on: 05/24/2024 12:00:00 AM.. Authorization Expiration Date: May 24, 2024.

## 2023-05-25 NOTE — Telephone Encounter (Signed)
Please advise 

## 2023-05-25 NOTE — Telephone Encounter (Signed)
I called and spoke with the patient she is aware of the previous message.

## 2023-06-04 ENCOUNTER — Other Ambulatory Visit: Payer: Self-pay | Admitting: Student

## 2023-06-04 DIAGNOSIS — G479 Sleep disorder, unspecified: Secondary | ICD-10-CM

## 2023-06-05 MED ORDER — TRAZODONE HCL 100 MG PO TABS: 100.0000 mg | ORAL_TABLET | Freq: Every evening | ORAL | 1 refills | Status: DC | PRN

## 2023-06-29 ENCOUNTER — Other Ambulatory Visit: Payer: Self-pay | Admitting: Student

## 2023-06-29 ENCOUNTER — Encounter: Payer: Self-pay | Admitting: Student

## 2023-06-29 ENCOUNTER — Ambulatory Visit: Payer: Medicaid Other | Admitting: Student

## 2023-06-29 VITALS — BP 158/93 | HR 88 | Temp 98.3°F | Ht 64.0 in | Wt 241.5 lb

## 2023-06-29 DIAGNOSIS — Z6841 Body Mass Index (BMI) 40.0 and over, adult: Secondary | ICD-10-CM | POA: Diagnosis not present

## 2023-06-29 DIAGNOSIS — N938 Other specified abnormal uterine and vaginal bleeding: Secondary | ICD-10-CM

## 2023-06-29 DIAGNOSIS — M65311 Trigger thumb, right thumb: Secondary | ICD-10-CM | POA: Diagnosis not present

## 2023-06-29 DIAGNOSIS — R030 Elevated blood-pressure reading, without diagnosis of hypertension: Secondary | ICD-10-CM | POA: Diagnosis not present

## 2023-06-29 DIAGNOSIS — R7303 Prediabetes: Secondary | ICD-10-CM

## 2023-06-29 DIAGNOSIS — I10 Essential (primary) hypertension: Secondary | ICD-10-CM | POA: Insufficient documentation

## 2023-06-29 MED ORDER — WEGOVY 1.7 MG/0.75ML ~~LOC~~ SOAJ: 1.7000 mg | SUBCUTANEOUS | 2 refills | Status: DC

## 2023-06-29 MED ORDER — DICLOFENAC SODIUM 1 % EX GEL: 4.0000 g | Freq: Four times a day (QID) | CUTANEOUS | 2 refills | Status: DC

## 2023-06-29 MED ORDER — WEGOVY 1 MG/0.5ML ~~LOC~~ SOAJ
1.0000 mg | SUBCUTANEOUS | 1 refills | Status: DC
Start: 2023-06-29 — End: 2024-03-15

## 2023-06-29 NOTE — Assessment & Plan Note (Addendum)
 Continues to have right thumb base pain without much improvement with thumb splint and OTC meds for pain. Not worsening but constant since October. Right is NON-dominant hand. Has a sedentary job. Reports stuck in the morning and throughout day. Exam showed pain with thumb extension and TTP along palmar surface of 1st MCP. Hx of bilateral carpal tunnel surgery almost 10 years ago. Does not recall office or surgeon's name. May benefit with injections of that area. Will further characterize for any joint or bony changes with xray today.   Plan -Xray of right hand  -Continue thumb splint and PRN pain meds  -Voltaren  gel QID  -Referral to hand surgeon for possible injections

## 2023-06-29 NOTE — Assessment & Plan Note (Addendum)
 Resent OBGYN referral today. Patient reports hx of tubal ligation. Has irregular menstrual cycles which is better now with being on OCP. Patient still would like to re-establish care with OBGYN. Confirmed patient's phone number today.

## 2023-06-29 NOTE — Assessment & Plan Note (Addendum)
 BMI 41.45. Notes some weight loss with Wegovy . Currently on 1 mg weekly with good adherence. Plan to titrate up to 1.7 mg weekly once she completes her current supply.   Plan -Continue lifestyle modifications -Continue Wegovy , titrate up to 1.7 mg weekly x 4 weeks then if tolerated, increase to 2.4 mg (maintenance)

## 2023-06-29 NOTE — Assessment & Plan Note (Signed)
>>  ASSESSMENT AND PLAN FOR ELEVATED BP WITHOUT DIAGNOSIS OF HYPERTENSION WRITTEN ON 06/29/2023  7:27 PM BY Aymara Sassi, DO  BP elevated today on repeat at 150s/90. Denies any headache, blurry vision, chest pain, SOB, n/v. Prior BP reading for most are in normal range. Discussed starting BP med. Patient to record with home BP cuff 2-3 times a week and bring back at next OV.   Plan -BP log at home, bring at next visit -If BP remains elevated at next OV, then HTN, initiate antihypertensive therapy (ie CCB or thiazide)

## 2023-06-29 NOTE — Patient Instructions (Addendum)
 Thank you, Ms.Chenelle Mazzarella for allowing us  to provide your care today. Today we discussed   -Right thumb pain: will obtain xray of the hand right  -Voltaren  gel up to 4 times a day over area of pain   -Discussed speaking with hand surgeon regarding your right thumb base pain -Continue with thumb splint at night -Refilled Wegovy , plan to increase with new prescription once approved  Referrals ordered today:  Referral Orders         Ambulatory referral to Obstetrics / Gynecology       I have ordered the following medication/changed the following medications:   Stop the following medications: Medications Discontinued During This Encounter  Medication Reason   Semaglutide -Weight Management (WEGOVY ) 1 MG/0.5ML SOAJ Reorder   guaiFENesin -dextromethorphan (ROBITUSSIN DM) 100-10 MG/5ML syrup Completed Course     Start the following medications: Meds ordered this encounter  Medications   Semaglutide -Weight Management (WEGOVY ) 1 MG/0.5ML SOAJ    Sig: Inject 1 mg into the skin once a week.    Dispense:  2 mL    Refill:  1   diclofenac  Sodium (VOLTAREN ) 1 % GEL    Sig: Apply 4 g topically 4 (four) times daily.    Dispense:  150 g    Refill:  2     Follow up:  3 months    Should you have any questions or concerns please call the internal medicine clinic at [phone].    Geffrey Michaelsen, D.O. [facility]

## 2023-06-29 NOTE — Assessment & Plan Note (Signed)
 A1c 5.9 in 10/2022. Working on lifestyle modifications. On Wegovy. Yearly A1c checks (~5 months).

## 2023-06-29 NOTE — Progress Notes (Signed)
 CC: right thumb pain f/u   HPI:  Tara Branch is a 43 y.o. female living with a history stated below and presents today for right thumb pain f/u. Please see problem based assessment and plan for additional details.  Past Medical History:  Diagnosis Date   Anxiety    Arthritis    Asthma    CTS (carpal tunnel syndrome) 10/2013   left   Headache    Irregular periods    LMP 05/2013 - has had BTL   Seasonal allergies    Sleep apnea     Current Outpatient Medications on File Prior to Visit  Medication Sig Dispense Refill   dimenhyDRINATE ([employer] MOTION SICKNESS PO) Take by mouth.     gabapentin  (NEURONTIN ) 300 MG capsule Take 1 capsule (300 mg total) by mouth 3 (three) times daily. 90 capsule 0   ibuprofen  (ADVIL ) 800 MG tablet Take 1 tablet (800 mg total) by mouth 2 (two) times daily as needed. 30 tablet 0   lidocaine  (LIDODERM ) 5 % Place 1 patch onto the skin daily. Remove & Discard patch within 12 hours or as directed by MD 30 patch 0   norethindrone-ethinyl estradiol-iron (LOESTRIN FE) 1.5-30 MG-MCG tablet Take 1 tablet by mouth daily. 28 tablet 6   traZODone  (DESYREL ) 100 MG tablet Take 1 tablet (100 mg total) by mouth at bedtime as needed. for sleep 90 tablet 1   No current facility-administered medications on file prior to visit.    Family History  Problem Relation Age of Onset   Hypertension Mother    Arthritis Mother    Headache Mother    Healthy Father    Breast cancer Sister 60    Social History   Socioeconomic History   Marital status: Single    Spouse name: Not on file   Number of children: 2   Years of education: Currently 2nd yr college   Highest education level: Not on file  Occupational History   Occupation: call center   Occupation: student  Tobacco Use   Smoking status: Never   Smokeless tobacco: Never  Substance and Sexual Activity   Alcohol use: No   Drug use: No   Sexual activity: Not Currently    Birth control/protection: Surgical   Other Topics Concern   Not on file  Social History Narrative   Lives at home with mother, father and children.   Left-handed.   No caffeine use.   Social Drivers of Corporate Investment Banker Strain: Not on file  Food Insecurity: Not on file  Transportation Needs: Not on file  Physical Activity: Not on file  Stress: Not on file  Social Connections: Not on file  Intimate Partner Violence: Not on file    Review of Systems: ROS negative except for what is noted on the assessment and plan.  Vitals:   06/29/23 1334 06/29/23 1403  BP: (!) 156/86 (!) 158/93  Pulse: (!) 106 88  Temp: 98.3 F (36.8 C)   TempSrc: Oral   SpO2: 98%   Weight: 241 lb 8 oz (109.5 kg)   Height: 5' 4 (1.626 m)    Physical Exam: Constitutional: well-appearing female sitting in chair comfortably, in no acute distress Cardiovascular: regular rate Pulmonary/Chest: normal work of breathing on room air MSK: Right hand: TTP along 1st MCP but no swelling, bruising or erythema, able to do full ROM for right thumb with pain reproduced with extension at MCP, no clicking with ROM, no swelling or pain at 1st  IP joint or other digits  Neurological: alert & oriented x 3 Skin: warm and dry  Assessment & Plan:   Dysfunctional uterine bleeding Resent OBGYN referral today. Patient reports hx of tubal ligation. Has irregular menstrual cycles which is better now with being on OCP. Patient still would like to re-establish care with OBGYN. Confirmed patient's phone number today.   Morbid obesity with BMI of 40.0-44.9, adult (HCC) BMI 41.45. Notes some weight loss with Wegovy . Currently on 1 mg weekly with good adherence. Plan to titrate up to 1.7 mg weekly once she completes her current supply.   Plan -Continue lifestyle modifications -Continue Wegovy , titrate up to 1.7 mg weekly x 4 weeks then if tolerated, increase to 2.4 mg (maintenance)  Trigger finger of right thumb Continues to have right thumb base pain  without much improvement with thumb splint and OTC meds for pain. Not worsening but constant since October. Right is NON-dominant hand. Has a sedentary job. Reports stuck in the morning and throughout day. Exam showed pain with thumb extension and TTP along palmar surface of 1st MCP. Hx of bilateral carpal tunnel surgery almost 10 years ago. Does not recall office or surgeon's name. May benefit with injections of that area. Will further characterize for any joint or bony changes with xray today.   Plan -Xray of right hand  -Continue thumb splint and PRN pain meds  -Voltaren  gel QID  -Referral to hand surgeon for possible injections   Prediabetes A1c 5.9 in 10/2022. Working on lifestyle modifications. On Wegovy . Yearly A1c checks (~5 months).  Elevated BP without diagnosis of hypertension BP elevated today on repeat at 150s/90. Denies any headache, blurry vision, chest pain, SOB, n/v. Prior BP reading for most are in normal range. Discussed starting BP med. Patient to record with home BP cuff 2-3 times a week and bring back at next OV.   Plan -BP log at home, bring at next visit -If BP remains elevated at next OV, then HTN, initiate antihypertensive therapy (ie CCB or thiazide)   Patient discussed with Dr. Guilloud  Luccas Towell, D.O. [facility] Internal Medicine, PGY-2 Phone: [phone] Date 06/29/2023 Time 6:32 PM

## 2023-06-29 NOTE — Assessment & Plan Note (Signed)
 BP elevated today on repeat at 150s/90. Denies any headache, blurry vision, chest pain, SOB, n/v. Prior BP reading for most are in normal range. Discussed starting BP med. Patient to record with home BP cuff 2-3 times a week and bring back at next OV.   Plan -BP log at home, bring at next visit -If BP remains elevated at next OV, then HTN, initiate antihypertensive therapy (ie CCB or thiazide)

## 2023-06-30 NOTE — Telephone Encounter (Signed)
 Rx refill request has been address by pcp on 06/29/23.

## 2023-07-01 ENCOUNTER — Telehealth: Payer: Self-pay

## 2023-07-01 MED ORDER — DICLOFENAC SODIUM 1 % EX GEL: 4.0000 g | Freq: Four times a day (QID) | CUTANEOUS | 2 refills | Status: DC

## 2023-07-01 NOTE — Telephone Encounter (Signed)
 Pt is requesting a med change she stated  that the  (VOLTAREN)  is not covered by her insurance

## 2023-07-01 NOTE — Telephone Encounter (Signed)
 I resent the Rx as the generic version of Voltaren gel (apply up to 4 times daily) which she should be able to pick up at pharmacy. Thanks

## 2023-07-01 NOTE — Progress Notes (Signed)
 Internal Medicine Clinic Attending  Case discussed with the resident at the time of the visit.  We reviewed the resident's history and exam and pertinent patient test results.  I agree with the assessment, diagnosis, and plan of care documented in the resident's note.

## 2023-07-02 NOTE — Addendum Note (Signed)
 Addended by: Rana Snare on: 07/02/2023 01:33 PM   Modules accepted: Orders

## 2023-07-09 DIAGNOSIS — M4317 Spondylolisthesis, lumbosacral region: Secondary | ICD-10-CM | POA: Diagnosis not present

## 2023-07-18 DIAGNOSIS — R519 Headache, unspecified: Secondary | ICD-10-CM | POA: Diagnosis not present

## 2023-07-18 DIAGNOSIS — I10 Essential (primary) hypertension: Secondary | ICD-10-CM | POA: Diagnosis not present

## 2023-07-23 DIAGNOSIS — M47816 Spondylosis without myelopathy or radiculopathy, lumbar region: Secondary | ICD-10-CM | POA: Diagnosis not present

## 2023-07-29 ENCOUNTER — Ambulatory Visit: Payer: Medicaid Other | Admitting: Orthopedic Surgery

## 2023-08-05 ENCOUNTER — Other Ambulatory Visit: Payer: Self-pay | Admitting: Student

## 2023-08-21 DIAGNOSIS — M47816 Spondylosis without myelopathy or radiculopathy, lumbar region: Secondary | ICD-10-CM | POA: Diagnosis not present

## 2023-09-07 ENCOUNTER — Ambulatory Visit: Payer: Medicaid Other | Admitting: Orthopedic Surgery

## 2023-09-07 ENCOUNTER — Other Ambulatory Visit (INDEPENDENT_AMBULATORY_CARE_PROVIDER_SITE_OTHER): Payer: Self-pay

## 2023-09-07 DIAGNOSIS — M65311 Trigger thumb, right thumb: Secondary | ICD-10-CM

## 2023-09-07 MED ORDER — LIDOCAINE HCL 1 % IJ SOLN
1.0000 mL | INTRAMUSCULAR | Status: AC | PRN
  Administered 2023-09-07: 1 mL

## 2023-09-07 MED ORDER — BETAMETHASONE SOD PHOS & ACET 6 (3-3) MG/ML IJ SUSP
6.0000 mg | INTRAMUSCULAR | Status: AC | PRN
  Administered 2023-09-07: 6 mg via INTRA_ARTICULAR

## 2023-09-07 NOTE — Progress Notes (Signed)
 Tara Branch - 43 y.o. female MRN [id number]  Date of birth: 1981/01/26  Office Visit Note: Visit Date: 09/07/2023 PCP: Rana Snare, DO Referred by: Miguel Aschoff, MD  Subjective: No chief complaint on file.  HPI: Tara Branch is a pleasant 43 y.o. female who presents today for evaluation of right thumb trigger digit that has been present now for over 3 months.  She states that the triggering is happening on a regular basis, with progression of stiffness at the thumb IP joint.  She is having considerable pain as well with occasional locking that requires manual correction.  Denies any significant numbness or tingling.  She does have a history notable for bilateral open carpal tunnel release done in 2015.  Pertinent ROS were reviewed with the patient and found to be negative unless otherwise specified above in HPI.   Visit Reason: right thumb trigger Duration of symptoms: 3+ months Hand dominance: left Occupation: Desk Job Diabetic: No Smoking: No Heart/Lung History: none Blood Thinners: none  Prior Testing/EMG: none Injections (Date): none Treatments: trigger splint Prior Surgery: none  Assessment & Plan: Visit Diagnoses:  1. Trigger thumb, right thumb     Plan: Extensive discussion was had with the patient today regarding her right thumb trigger digit.  We discussed the etiology and pathophysiology of stenosing tenosynovitis.  We discussed conservative versus surgical treatment modalities.  From a conservative standpoint, we discussed activity modification, splinting, therapy and injections.  From a surgical standpoint, we discussed the possibility for trigger digit release as well as all risk and benefits associated.  Given that she has not trialed conservative treatments outside of splinting, patient is appropriate candidate for cortisone injection to the right thumb finger A1 pulley for symptom relief.  Risks and benefit of the cortisone injection were  discussed in detail, patient agreed to proceed.  Injection was provided today without issue, patient will return in approximate 6 weeks time for a recheck.   Follow-up: No follow-ups on file.   Meds & Orders: No orders of the defined types were placed in this encounter.   Orders Placed This Encounter  Procedures   Hand/UE Inj   XR Hand Complete Right     Procedures: Hand/UE Inj for trigger finger on 09/07/2023 8:34 PM Indications: pain Details: 25 G needle, volar approach Medications: 1 mL lidocaine 1 %; 6 mg betamethasone acetate-betamethasone sodium phosphate 6 (3-3) MG/ML Outcome: tolerated well, no immediate complications Consent was given by the patient. Patient was prepped and draped in the usual sterile fashion.          Clinical History: No specialty comments available.  She reports that she has never smoked. She has never used smokeless tobacco.  Recent Labs    11/19/22 1623  HGBA1C 5.8*    Objective:   Vital Signs: There were no vitals taken for this visit.  Physical Exam  Gen: Well-appearing, in no acute distress; non-toxic CV: Regular Rate. Well-perfused. Warm.  Resp: Breathing unlabored on room air; no wheezing. Psych: Fluid speech in conversation; appropriate affect; normal thought process  Ortho Exam Right hand: - Palpable nodule at the A1 pulley of the thumb, associated tenderness - Notable clicking with deep flexion of the thumb finger, range of motion at the IP joint is limited, 0-35, slightly improved passively, there is evidence of locking with deep flexion - Sensation intact distally, hand remains warm well-perfused   Imaging: XR Hand Complete Right Result Date: 09/07/2023 X-rays of the right hand, multiple views were obtained  X-rays demonstrate stable appearance of the radiocarpal and midcarpal articulations, mild subluxation at the right thumb [facility] joint is appreciated.  Mild ulnar positivity without evidence of impaction.   Past  Medical/Family/Surgical/Social History: Medications & Allergies reviewed per EMR, new medications updated. Patient Active Problem List   Diagnosis Date Noted   Elevated BP without diagnosis of hypertension 06/29/2023   Trigger finger of right thumb 05/14/2023   Uses oral contraception 02/17/2023   Prediabetes 02/04/2023   Morbid obesity with BMI of 40.0-44.9, adult (HCC) 11/19/2022   At risk for obstructive sleep apnea 11/19/2022   Difficulty sleeping 11/19/2022   Chronic low back pain 11/19/2022   Chronic migraine 09/15/2016   Dysfunctional uterine bleeding 05/01/2012   PCOS (polycystic ovarian syndrome) 05/01/2012   Past Medical History:  Diagnosis Date   Anxiety    Arthritis    Asthma    CTS (carpal tunnel syndrome) 10/2013   left   Headache    Irregular periods    LMP 05/2013 - has had BTL   Seasonal allergies    Sleep apnea    Family History  Problem Relation Age of Onset   Hypertension Mother    Arthritis Mother    Headache Mother    Healthy Father    Breast cancer Sister 11   Past Surgical History:  Procedure Laterality Date   CARPAL TUNNEL RELEASE Right 07/11/2013   Procedure: RIGHT CARPAL TUNNEL RELEASE;  Surgeon: Tami Ribas, MD;  Location: [facility];  Service: Orthopedics;  Laterality: Right;   CARPAL TUNNEL RELEASE Left 10/28/2013   Procedure: LEFT CARPAL TUNNEL RELEASE;  Surgeon: Tami Ribas, MD;  Location: [facility];  Service: Orthopedics;  Laterality: Left;   CESAREAN SECTION  12/12/2003   TUBAL LIGATION  05/10/2006   UMBILICAL HERNIA REPAIR N/A 10/23/2021   Procedure: OPEN UMBILICAL HERNIA REPAIR WITH MESH;  Surgeon: Abigail Miyamoto, MD;  Location: [facility];  Service: General;  Laterality: N/A;  LMA   WISDOM TOOTH EXTRACTION     Social History   Occupational History   Occupation: call center   Occupation: student  Tobacco Use   Smoking status: Never   Smokeless tobacco: Never  Substance and  Sexual Activity   Alcohol use: No   Drug use: No   Sexual activity: Not Currently    Birth control/protection: Surgical    Hodges Treiber Fara Boros) Denese Killings, M.D.  [facility], [facility]

## 2023-09-14 ENCOUNTER — Other Ambulatory Visit: Payer: Self-pay | Admitting: Student

## 2023-09-14 DIAGNOSIS — M47816 Spondylosis without myelopathy or radiculopathy, lumbar region: Secondary | ICD-10-CM | POA: Diagnosis not present

## 2023-10-01 DIAGNOSIS — F411 Generalized anxiety disorder: Secondary | ICD-10-CM | POA: Diagnosis not present

## 2023-10-02 DIAGNOSIS — F411 Generalized anxiety disorder: Secondary | ICD-10-CM | POA: Diagnosis not present

## 2023-10-03 DIAGNOSIS — F411 Generalized anxiety disorder: Secondary | ICD-10-CM | POA: Diagnosis not present

## 2023-10-05 DIAGNOSIS — F411 Generalized anxiety disorder: Secondary | ICD-10-CM | POA: Diagnosis not present

## 2023-10-06 DIAGNOSIS — F411 Generalized anxiety disorder: Secondary | ICD-10-CM | POA: Diagnosis not present

## 2023-10-10 ENCOUNTER — Other Ambulatory Visit: Payer: Self-pay | Admitting: Student

## 2023-10-10 DIAGNOSIS — F411 Generalized anxiety disorder: Secondary | ICD-10-CM | POA: Diagnosis not present

## 2023-10-14 DIAGNOSIS — F411 Generalized anxiety disorder: Secondary | ICD-10-CM | POA: Diagnosis not present

## 2023-10-15 DIAGNOSIS — F411 Generalized anxiety disorder: Secondary | ICD-10-CM | POA: Diagnosis not present

## 2023-10-19 ENCOUNTER — Ambulatory Visit: Admitting: Orthopedic Surgery

## 2023-10-21 ENCOUNTER — Telehealth: Payer: Self-pay | Admitting: Student

## 2023-10-21 ENCOUNTER — Encounter: Admitting: Student

## 2023-10-21 ENCOUNTER — Ambulatory Visit: Payer: Self-pay | Admitting: Student

## 2023-10-21 NOTE — Telephone Encounter (Signed)
 Attempted to call patient again but no response. No Show for this appointment. She will need to reschedule appointment for a future date.

## 2023-10-21 NOTE — Telephone Encounter (Signed)
 Patient has not logged in for MyChart video visit. Left voicemail at 2:00 PM on patient's phone to call back office if trouble logging into visit. Please transfer call to the [facility] office if she returns my call.

## 2023-11-23 ENCOUNTER — Ambulatory Visit: Admitting: Orthopedic Surgery

## 2024-02-25 ENCOUNTER — Telehealth: Payer: Self-pay | Admitting: *Deleted

## 2024-02-25 NOTE — Telephone Encounter (Signed)
 Will forward to PCP. Copied from CRM #[id number]. Topic: Clinical - Medication Question >> Feb 25, 2024  2:31 PM Cherylann S wrote: Reason for CRM: Patient is requesting to be prescribed the B12 injections as she is feeling nauseated when taking her Wegovy  medication. She expressed this concern to her Ob and they advised that she contact her PCP and request to take the B12 injection. Please contact patient at [phone].

## 2024-02-29 ENCOUNTER — Ambulatory Visit: Admitting: Orthopedic Surgery

## 2024-02-29 DIAGNOSIS — M65311 Trigger thumb, right thumb: Secondary | ICD-10-CM | POA: Diagnosis not present

## 2024-02-29 NOTE — Progress Notes (Signed)
 Tara Branch - 43 y.o. female MRN [id number]  Date of birth: 1980-08-11  Office Visit Note: Visit Date: 02/29/2024 PCP: Elicia Sharper, DO Referred by: Elicia Sharper, DO  Subjective: No chief complaint on file.  HPI: Tara Branch is a pleasant 43 y.o. female who returns today for ongoing right thumb trigger digit.  At her most recent visit approximately 5 months prior, she underwent cortisone injection to the right thumb A1 pulley.  Her symptoms have recurred despite initial relief with the injection.  She states that the triggering is happening on a regular basis, with progression of stiffness at the thumb IP joint.  She is having considerable pain as well with occasional locking that requires manual correction.    Pertinent ROS were reviewed with the patient and found to be negative unless otherwise specified above in HPI.    Assessment & Plan: Visit Diagnoses:  1. Trigger thumb, right thumb      Plan: Extensive discussion was had with the patient today regarding her right thumb trigger digit.  We discussed the etiology and pathophysiology of stenosing tenosynovitis.  We discussed conservative versus surgical treatment modalities.  From a conservative standpoint, we discussed activity modification, splinting, therapy and injections.  From a surgical standpoint, we discussed the possibility for trigger digit release as well as all risk and benefits associated.  Given that patient has trialed conservative treatments such as activity modification and cortisone injection with symptoms refractory to conservative care, patient is indicated for right thumb trigger digit release.    Risks and benefits of the procedure were discussed, risks including but not limited to infection, bleeding, scarring, stiffness, nerve injury, tendon injury, vascular injury, recurrence of symptoms and need for subsequent operation.  We also discussed the appropriate postoperative protocol and timeframe for  return to activities and function.  Forms of anesthesia were also discussed.  Patient expressed understanding.  Understanding the above, she would like to proceed with right thumb trigger digit release under IV sedation.    Follow-up: No follow-ups on file.   Meds & Orders: No orders of the defined types were placed in this encounter.   No orders of the defined types were placed in this encounter.    Procedures: No procedures performed      Clinical History: No specialty comments available.  She reports that she has never smoked. She has never used smokeless tobacco.  No results for input(s): HGBA1C, LABURIC in the last 8760 hours.   Objective:   Vital Signs: There were no vitals taken for this visit.  Physical Exam  Gen: Well-appearing, in no acute distress; non-toxic CV: Regular Rate. Well-perfused. Warm.  Resp: Breathing unlabored on room air; no wheezing. Psych: Fluid speech in conversation; appropriate affect; normal thought process  Ortho Exam Right hand: - Palpable nodule at the A1 pulley of the thumb, associated tenderness - Notable clicking with deep flexion of the thumb finger, range of motion at the IP joint is limited, 0-35, slightly improved passively, there is evidence of locking with deep flexion - Sensation intact distally, hand remains warm well-perfused   Imaging: No results found.   Past Medical/Family/Surgical/Social History: Medications & Allergies reviewed per EMR, new medications updated. Patient Active Problem List   Diagnosis Date Noted   Elevated BP without diagnosis of hypertension 06/29/2023   Trigger finger of right thumb 05/14/2023   Uses oral contraception 02/17/2023   Prediabetes 02/04/2023   Morbid obesity with BMI of 40.0-44.9, adult (HCC) 11/19/2022   At risk  for obstructive sleep apnea 11/19/2022   Difficulty sleeping 11/19/2022   Chronic low back pain 11/19/2022   Chronic migraine 09/15/2016   Dysfunctional uterine bleeding  05/01/2012   PCOS (polycystic ovarian syndrome) 05/01/2012   Past Medical History:  Diagnosis Date   Anxiety    Arthritis    Asthma    CTS (carpal tunnel syndrome) 10/2013   left   Headache    Irregular periods    LMP 05/2013 - has had BTL   Seasonal allergies    Sleep apnea    Family History  Problem Relation Age of Onset   Hypertension Mother    Arthritis Mother    Headache Mother    Healthy Father    Breast cancer Sister 68   Past Surgical History:  Procedure Laterality Date   CARPAL TUNNEL RELEASE Right 07/11/2013   Procedure: RIGHT CARPAL TUNNEL RELEASE;  Surgeon: Franky JONELLE Curia, MD;  Location: [facility];  Service: Orthopedics;  Laterality: Right;   CARPAL TUNNEL RELEASE Left 10/28/2013   Procedure: LEFT CARPAL TUNNEL RELEASE;  Surgeon: Franky JONELLE Curia, MD;  Location: [facility];  Service: Orthopedics;  Laterality: Left;   CESAREAN SECTION  12/12/2003   TUBAL LIGATION  05/10/2006   UMBILICAL HERNIA REPAIR N/A 10/23/2021   Procedure: OPEN UMBILICAL HERNIA REPAIR WITH MESH;  Surgeon: Vernetta Berg, MD;  Location: [facility];  Service: General;  Laterality: N/A;  LMA   WISDOM TOOTH EXTRACTION     Social History   Occupational History   Occupation: call center   Occupation: student  Tobacco Use   Smoking status: Never   Smokeless tobacco: Never  Substance and Sexual Activity   Alcohol use: No   Drug use: No   Sexual activity: Not Currently    Birth control/protection: Surgical    Rashawd Laskaris Estela) Arlinda, M.D. [facility], [facility]

## 2024-03-14 ENCOUNTER — Other Ambulatory Visit: Payer: Self-pay

## 2024-03-14 ENCOUNTER — Ambulatory Visit: Admitting: Orthopedic Surgery

## 2024-03-14 DIAGNOSIS — M65311 Trigger thumb, right thumb: Secondary | ICD-10-CM

## 2024-03-14 DIAGNOSIS — M47816 Spondylosis without myelopathy or radiculopathy, lumbar region: Secondary | ICD-10-CM | POA: Diagnosis not present

## 2024-03-15 ENCOUNTER — Ambulatory Visit: Payer: Self-pay | Admitting: Student

## 2024-03-15 ENCOUNTER — Encounter: Payer: Self-pay | Admitting: Student

## 2024-03-15 VITALS — BP 137/87 | HR 83 | Temp 98.4°F | Ht 64.0 in | Wt 239.2 lb

## 2024-03-15 DIAGNOSIS — I10 Essential (primary) hypertension: Secondary | ICD-10-CM

## 2024-03-15 DIAGNOSIS — Z3041 Encounter for surveillance of contraceptive pills: Secondary | ICD-10-CM | POA: Diagnosis not present

## 2024-03-15 DIAGNOSIS — Z114 Encounter for screening for human immunodeficiency virus [HIV]: Secondary | ICD-10-CM

## 2024-03-15 DIAGNOSIS — G479 Sleep disorder, unspecified: Secondary | ICD-10-CM | POA: Diagnosis not present

## 2024-03-15 DIAGNOSIS — R7303 Prediabetes: Secondary | ICD-10-CM

## 2024-03-15 DIAGNOSIS — Z1159 Encounter for screening for other viral diseases: Secondary | ICD-10-CM

## 2024-03-15 DIAGNOSIS — Z0000 Encounter for general adult medical examination without abnormal findings: Secondary | ICD-10-CM | POA: Insufficient documentation

## 2024-03-15 DIAGNOSIS — Z6841 Body Mass Index (BMI) 40.0 and over, adult: Secondary | ICD-10-CM | POA: Diagnosis not present

## 2024-03-15 MED ORDER — WEGOVY 1.7 MG/0.75ML ~~LOC~~ SOAJ
1.7000 mg | SUBCUTANEOUS | 2 refills | Status: DC
Start: 2024-03-15 — End: 2024-04-12

## 2024-03-15 MED ORDER — AMLODIPINE BESYLATE 5 MG PO TABS: 5.0000 mg | ORAL_TABLET | Freq: Every day | ORAL | 11 refills | Status: AC

## 2024-03-15 MED ORDER — NORETHIN ACE-ETH ESTRAD-FE 1.5-30 MG-MCG PO TABS: 1.0000 | ORAL_TABLET | Freq: Every day | ORAL | 3 refills | Status: AC

## 2024-03-15 NOTE — Assessment & Plan Note (Signed)
Refilled birth control

## 2024-03-15 NOTE — Assessment & Plan Note (Addendum)
 Sleep has improved and currently not taking trazodone  nightly.

## 2024-03-15 NOTE — Assessment & Plan Note (Signed)
 BP elevated over last 2-3 visits.  Initial BP 149/98.  Does not check blood pressure at home.  Agreeable to starting antihypertensive.  Plan -Start amlodipine  5 mg daily RN BP check in 2-4 weeks

## 2024-03-15 NOTE — Assessment & Plan Note (Signed)
 A1c 5.8 in 10/2022.  Will repeat A1c today.

## 2024-03-15 NOTE — Patient Instructions (Addendum)
 Thank you, [name] for allowing us  to provide your care today. Today we discussed:  -Blood work today, I will call with results  -Medication refills sent today -Start amlodipine  5 mg once a day for blood pressure   I have ordered the following labs for you:  Lab Orders         Vitamin B12         Basic metabolic panel with GFR         CBC no Diff         Hemoglobin A1c      I have ordered the following medication/changed the following medications:  Start the following medications: Meds ordered this encounter  Medications   semaglutide -weight management (WEGOVY ) 1.7 MG/0.75ML SOAJ SQ injection    Sig: Inject 1.7 mg into the skin once a week.    Dispense:  3 mL    Refill:  2   norethindrone-ethinyl estradiol-iron (JUNEL FE 1.5/30) 1.5-30 MG-MCG tablet    Sig: Take 1 tablet by mouth daily.    Dispense:  84 tablet    Refill:  3   amLODipine  (NORVASC ) 5 MG tablet    Sig: Take 1 tablet (5 mg total) by mouth daily.    Dispense:  30 tablet    Refill:  11     Follow up: 2-4 weeks for nurse blood pressure visit    Should you have any questions or concerns please call the internal medicine clinic at [phone].    Jordanna Hendrie, D.O. [facility]

## 2024-03-15 NOTE — Assessment & Plan Note (Signed)
 Agreeable to screening for HIV and hepatitis C.  Annual physical today including checking BMP, CBC, vitamin B12, lipid panel.

## 2024-03-15 NOTE — Assessment & Plan Note (Addendum)
 Body mass index is 41.06 kg/m.  Taking Wegovy  1.7 mg weekly.  There was a gap of at least 3 months where she did not/unable to get Wegovy .  Restarted a month ago.  Initially reported some nausea and vomiting but has since improved.  Tolerating p.o. intake.  Plan -Continue Wegovy  1.7 mg weekly, if patient agrees can consider titrating up if tolerating

## 2024-03-15 NOTE — Progress Notes (Signed)
 CC: Routine follow-up  HPI: Tara Branch is a 43 y.o. female living with a history stated below and presents today for routine follow-up. Please see problem based assessment and plan for additional details.  Past Medical History:  Diagnosis Date   Anxiety    Arthritis    Asthma    CTS (carpal tunnel syndrome) 10/2013   left   Headache    Irregular periods    LMP 05/2013 - has had BTL   Seasonal allergies    Sleep apnea     Current Outpatient Medications on File Prior to Visit  Medication Sig Dispense Refill   diclofenac  Sodium (VOLTAREN  ARTHRITIS PAIN) 1 % GEL Apply 4 g topically 4 (four) times daily. 150 g 2   dimenhyDRINATE ([facility] MOTION SICKNESS PO) Take by mouth.     ibuprofen  (ADVIL ) 800 MG tablet TAKE 1 TABLET BY MOUTH 2 TIMES DAILY AS NEEDED. 30 tablet 0   lidocaine  (LIDODERM ) 5 % Place 1 patch onto the skin daily. Remove & Discard patch within 12 hours or as directed by MD 30 patch 0   traZODone  (DESYREL ) 100 MG tablet Take 1 tablet (100 mg total) by mouth at bedtime as needed. for sleep 90 tablet 1   No current facility-administered medications on file prior to visit.    Family History  Problem Relation Age of Onset   Hypertension Mother    Arthritis Mother    Headache Mother    Healthy Father    Breast cancer Sister 40    Social History   Socioeconomic History   Marital status: Single    Spouse name: Not on file   Number of children: 2   Years of education: Currently 2nd yr college   Highest education level: Not on file  Occupational History   Occupation: call center   Occupation: student  Tobacco Use   Smoking status: Never   Smokeless tobacco: Never  Substance and Sexual Activity   Alcohol use: No   Drug use: No   Sexual activity: Not Currently    Birth control/protection: Surgical  Other Topics Concern   Not on file  Social History Narrative   Lives at home with mother, father and children.   Left-handed.   No caffeine use.   Social  Drivers of Health   Financial Resource Strain: High Risk (03/15/2024)   Overall Financial Resource Strain (CARDIA)    Difficulty of Paying Living Expenses: Hard  Food Insecurity: Food Insecurity Present (03/15/2024)   Hunger Vital Sign    Worried About Running Out of Food in the Last Year: Sometimes true    Ran Out of Food in the Last Year: Sometimes true  Transportation Needs: No Transportation Needs (03/15/2024)   PRAPARE - Administrator, Civil Service (Medical): No    Lack of Transportation (Non-Medical): No  Physical Activity: Insufficiently Active (03/15/2024)   Exercise Vital Sign    Days of Exercise per Week: 2 days    Minutes of Exercise per Session: 30 min  Stress: Stress Concern Present (03/15/2024)   [employer] of Occupational Health - Occupational Stress Questionnaire    Feeling of Stress: Rather much  Social Connections: Moderately Integrated (03/15/2024)   Social Connection and Isolation Panel    Frequency of Communication with Friends and Family: More than three times a week    Frequency of Social Gatherings with Friends and Family: More than three times a week    Attends Religious Services: 1 to 4 times per  year    Active Member of Clubs or Organizations: No    Attends Banker Meetings: Never    Marital Status: Living with partner  Intimate Partner Violence: Not At Risk (03/15/2024)   Humiliation, Afraid, Rape, and Kick questionnaire    Fear of Current or Ex-Partner: No    Emotionally Abused: No    Physically Abused: No    Sexually Abused: No    Review of Systems: ROS negative except for what is noted on the assessment and plan.  Vitals:   03/15/24 1452 03/15/24 1533  BP: (!) 149/98 137/87  Pulse: 81 83  Temp: 98.4 F (36.9 C)   TempSrc: Oral   SpO2: 100%   Weight: 239 lb 3.2 oz (108.5 kg)   Height: 5' 4 (1.626 m)    Physical Exam: Constitutional: well-appearing female sitting in chair, in no acute distress Cardiovascular:  regular rate and rhythm Pulmonary/Chest: normal work of breathing on room air, lungs clear to auscultation bilaterally Neurological: alert & oriented x 3 Skin: warm and dry Psych: Normal mood and affect  Assessment & Plan:   Assessment & Plan Primary hypertension BP elevated over last 2-3 visits.  Initial BP 149/98.  Does not check blood pressure at home.  Agreeable to starting antihypertensive.  Plan -Start amlodipine  5 mg daily RN BP check in 2-4 weeks Prediabetes A1c 5.8 in 10/2022.  Will repeat A1c today. Uses oral contraception Refilled birth control. Morbid obesity with BMI of 40.0-44.9, adult (HCC) Body mass index is 41.06 kg/m.  Taking Wegovy  1.7 mg weekly.  There was a gap of at least 3 months where she did not/unable to get Wegovy .  Restarted a month ago.  Initially reported some nausea and vomiting but has since improved.  Tolerating p.o. intake.  Plan -Continue Wegovy  1.7 mg weekly, if patient agrees can consider titrating up if tolerating Healthcare maintenance Agreeable to screening for HIV and hepatitis C.  Annual physical today including checking BMP, CBC, vitamin B12, lipid panel. Difficulty sleeping Sleep has improved and currently not taking trazodone  nightly.  Return in about 2 weeks (around 03/29/2024) for RN BP check.   Patient discussed with Dr. Machen  Demaurion Dicioccio, D.O. [facility], PGY-3 Phone: [phone] Date 03/15/2024 Time 5:33 PM

## 2024-03-16 ENCOUNTER — Ambulatory Visit: Payer: Self-pay | Admitting: Student

## 2024-03-16 LAB — LIPID PANEL
Chol/HDL Ratio: 2.5 ratio (ref 0.0–4.4)
Cholesterol, Total: 130 mg/dL (ref 100–199)
HDL: 52 mg/dL (ref >39)
LDL Chol Calc (NIH): 66 mg/dL (ref 0–99)
Triglycerides: 53 mg/dL (ref 0–149)
VLDL Cholesterol Cal: 12 mg/dL (ref 5–40)

## 2024-03-16 LAB — CBC
Hematocrit: 36.4 % (ref 34.0–46.6)
Hemoglobin: 11.5 g/dL (ref 11.1–15.9)
MCH: 27.4 pg (ref 26.6–33.0)
MCHC: 31.6 g/dL (ref 31.5–35.7)
MCV: 87 fL (ref 79–97)
Platelets: 403 x10E3/uL (ref 150–450)
RBC: 4.19 x10E6/uL (ref 3.77–5.28)
RDW: 14.5 % (ref 11.7–15.4)
WBC: 7.9 x10E3/uL (ref 3.4–10.8)

## 2024-03-16 LAB — VITAMIN B12: Vitamin B-12: 1363 pg/mL — ABNORMAL HIGH (ref 232–1245)

## 2024-03-16 LAB — BASIC METABOLIC PANEL WITH GFR
BUN/Creatinine Ratio: 13 (ref 9–23)
BUN: 10 mg/dL (ref 6–24)
CO2: 17 mmol/L — ABNORMAL LOW (ref 20–29)
Calcium: 9.4 mg/dL (ref 8.7–10.2)
Chloride: 103 mmol/L (ref 96–106)
Creatinine, Ser: 0.78 mg/dL (ref 0.57–1.00)
Glucose: 81 mg/dL (ref 70–99)
Potassium: 3.9 mmol/L (ref 3.5–5.2)
Sodium: 137 mmol/L (ref 134–144)
eGFR: 98 mL/min/1.73 (ref >59)

## 2024-03-16 LAB — HCV INTERPRETATION

## 2024-03-16 LAB — HCV AB W REFLEX TO QUANT PCR: HCV Ab: NONREACTIVE

## 2024-03-16 LAB — HEMOGLOBIN A1C
Est. average glucose Bld gHb Est-mCnc: 114 mg/dL
Hgb A1c MFr Bld: 5.6 % (ref 4.8–5.6)

## 2024-03-16 LAB — HIV ANTIBODY (ROUTINE TESTING W REFLEX): HIV Screen 4th Generation wRfx: NONREACTIVE

## 2024-03-16 NOTE — Progress Notes (Signed)
 Internal Medicine Clinic Attending  Case discussed with the resident at the time of the visit.  We reviewed the resident's history and exam and pertinent patient test results.  I agree with the assessment, diagnosis, and plan of care documented in the resident's note.

## 2024-03-24 ENCOUNTER — Ambulatory Visit: Payer: Self-pay

## 2024-03-24 ENCOUNTER — Other Ambulatory Visit: Payer: Self-pay

## 2024-03-24 ENCOUNTER — Encounter (HOSPITAL_BASED_OUTPATIENT_CLINIC_OR_DEPARTMENT_OTHER): Payer: Self-pay | Admitting: Orthopedic Surgery

## 2024-03-24 NOTE — Telephone Encounter (Signed)
 Called pt  who stated she had nose bleeds last 2 days; stated this is the first time she has had back to back nosebleeds. Her appt is 10/14. She wants to know if her doctor could order MRI in the meantime - I told her I will ask her doctor.

## 2024-03-24 NOTE — Progress Notes (Signed)
   03/24/24 1611  PAT Phone Screen  Is the patient taking a GLP-1 receptor agonist? Yes  Has the patient been informed on holding medication? Yes (9/26 last dose)  Do You Have Diabetes? No  Do You Have Hypertension? Yes  Have You Ever Been to the ER for Asthma? No  Have You Taken Oral Steroids in the Past 3 Months? No  Do you Take Phenteramine or any Other Diet Drugs? No  Recent  Lab Work, EKG, CXR? No  Do you have a history of heart problems? No  Any Recent Hospitalizations? No  Height 5' 4 (1.626 m)  Weight 108.5 kg  Pat Appointment Scheduled Yes (EKG)

## 2024-03-24 NOTE — Telephone Encounter (Signed)
 FYI Only or Action Required?: FYI only for provider.  Patient was last seen in primary care on 03/15/2024 by Elicia Sharper, DO.  Called Nurse Triage reporting Epistaxis.  Symptoms began several months ago.  Interventions attempted: Rest, hydration, or home remedies.  Symptoms are: gradually worsening.  Triage Disposition: See PCP Within 2 Weeks  Patient/caregiver understands and will follow disposition?: Yes                             Copied from CRM [id number]. Topic: Clinical - Red Word Triage >> Mar 24, 2024  2:49 PM Suzette B wrote: [facility] that prompted transfer to Nurse Triage: patient states that she is having constant nose bleeds, provider informed patient if they started again to call the line, she's had 2 in the past 2 days Reason for Disposition  Hard-to-stop nosebleeds are a chronic symptom (recurrent or ongoing AND present > 4 weeks)  Answer Assessment - Initial Assessment Questions 1. AMOUNT OF BLEEDING: How bad is the bleeding? How much blood was lost? Has the bleeding stopped?     States bleeding stops after several minutes of applying pressure and using tissues 2. ONSET: When did the nosebleed start?      Originally started in June, went away, had 2 nosebleeds within past 2 days  3. FREQUENCY: How many nosebleeds have you had in the last 24 hours?      2 within past 2 days  4. RECURRENT SYMPTOMS: Have there been other recent nosebleeds? If Yes, ask: How long did it take you to stop the bleeding? What worked best?      One previous nosebleed in August and one in June 5. CAUSE: What do you think caused this nosebleed?     Unsure 6. LOCAL FACTORS: Do you have any cold symptoms?, Have you been rubbing or picking at your nose?     Denies 7. SYSTEMIC FACTORS: Do you have high blood pressure or any bleeding problems?     HTN, denies other than irregular bleeding related to menstrual cycle 8. BLOOD THINNERS: Do you take  any blood thinners? (e.g., aspirin, clopidogrel / Plavix, coumadin, heparin ). Notes: Other strong blood thinners include: Arixtra (fondaparinux), Eliquis (apixaban), Pradaxa (dabigatran), and Xarelto (rivaroxaban).     Denies 9. OTHER SYMPTOMS: Do you have any other symptoms? (e.g., lightheadedness)     States nosebleeds only happen out of right nostril, fatigue and lack of appetite- states she take Wegovy , denies dizziness, denies lightheadedness  Protocols used: Nosebleed-A-AH

## 2024-03-25 NOTE — Telephone Encounter (Signed)
 Pt called / informed of Dr Hildy response; pt stated ok.

## 2024-03-31 NOTE — Anesthesia Preprocedure Evaluation (Signed)
 Anesthesia Evaluation    Reviewed: Allergy & Precautions, Patient's Chart, lab work & pertinent test results, Unable to perform ROS - Chart review only  Airway Mallampati: II  TM Distance: >3 FB     Dental  (+) Dental Advisory Given   Pulmonary asthma , sleep apnea    breath sounds clear to auscultation       Cardiovascular hypertension,  Rhythm:Regular Rate:Normal     Neuro/Psych  Headaches  Neuromuscular disease    GI/Hepatic negative GI ROS, Neg liver ROS,,,  Endo/Other    Class 3 obesity  Renal/GU negative Renal ROS     Musculoskeletal  (+) Arthritis ,    Abdominal   Peds  Hematology negative hematology ROS (+)   Anesthesia Other Findings   Reproductive/Obstetrics                              Anesthesia Physical Anesthesia Plan  ASA: 3  Anesthesia Plan: General   Post-op Pain Management: Tylenol  PO (pre-op)* and Toradol  IV (intra-op)*   Induction: Intravenous  PONV Risk Score and Plan: 3 and Dexamethasone , Ondansetron , Midazolam  and Treatment may vary due to age or medical condition  Airway Management Planned: LMA  Additional Equipment: None  Intra-op Plan:   Post-operative Plan: Extubation in OR  Informed Consent: I have reviewed the patients History and Physical, chart, labs and discussed the procedure including the risks, benefits and alternatives for the proposed anesthesia with the patient or authorized representative who has indicated his/her understanding and acceptance.     Dental advisory given  Plan Discussed with: CRNA  Anesthesia Plan Comments:          Anesthesia Quick Evaluation

## 2024-04-01 ENCOUNTER — Ambulatory Visit (HOSPITAL_BASED_OUTPATIENT_CLINIC_OR_DEPARTMENT_OTHER): Admission: RE | Admit: 2024-04-01 | Source: Home / Self Care | Admitting: Orthopedic Surgery

## 2024-04-01 ENCOUNTER — Encounter (HOSPITAL_BASED_OUTPATIENT_CLINIC_OR_DEPARTMENT_OTHER): Payer: Self-pay | Admitting: Anesthesiology

## 2024-04-01 DIAGNOSIS — R053 Chronic cough: Secondary | ICD-10-CM | POA: Diagnosis not present

## 2024-04-01 DIAGNOSIS — Z01818 Encounter for other preprocedural examination: Secondary | ICD-10-CM

## 2024-04-01 DIAGNOSIS — Z20822 Contact with and (suspected) exposure to covid-19: Secondary | ICD-10-CM | POA: Diagnosis not present

## 2024-04-01 DIAGNOSIS — I10 Essential (primary) hypertension: Secondary | ICD-10-CM

## 2024-04-01 DIAGNOSIS — J029 Acute pharyngitis, unspecified: Secondary | ICD-10-CM | POA: Diagnosis not present

## 2024-04-01 DIAGNOSIS — R0981 Nasal congestion: Secondary | ICD-10-CM | POA: Diagnosis not present

## 2024-04-01 DIAGNOSIS — J0110 Acute frontal sinusitis, unspecified: Secondary | ICD-10-CM | POA: Diagnosis not present

## 2024-04-01 SURGERY — RELEASE, A1 PULLEY, FOR TRIGGER FINGER
Anesthesia: Monitor Anesthesia Care | Laterality: Right

## 2024-04-01 MED ORDER — LIDOCAINE HCL (PF) 1 % IJ SOLN
INTRAMUSCULAR | Status: AC
  Filled 2024-04-01: qty 30

## 2024-04-01 MED ORDER — LIDOCAINE-EPINEPHRINE (PF) 1 %-1:200000 IJ SOLN
INTRAMUSCULAR | Status: AC
  Filled 2024-04-01: qty 90

## 2024-04-01 MED ORDER — ROPIVACAINE HCL 5 MG/ML IJ SOLN
INTRAMUSCULAR | Status: AC
Start: 2024-04-01 — End: 2024-04-01
  Filled 2024-04-01: qty 30

## 2024-04-05 ENCOUNTER — Ambulatory Visit: Payer: Self-pay | Admitting: Student

## 2024-04-05 VITALS — BP 140/78 | HR 97 | Temp 98.3°F | Ht 64.0 in | Wt 232.4 lb

## 2024-04-05 DIAGNOSIS — R0981 Nasal congestion: Secondary | ICD-10-CM | POA: Diagnosis not present

## 2024-04-05 DIAGNOSIS — R040 Epistaxis: Secondary | ICD-10-CM | POA: Diagnosis not present

## 2024-04-05 DIAGNOSIS — R0683 Snoring: Secondary | ICD-10-CM | POA: Diagnosis not present

## 2024-04-05 DIAGNOSIS — Z7985 Long-term (current) use of injectable non-insulin antidiabetic drugs: Secondary | ICD-10-CM | POA: Diagnosis not present

## 2024-04-05 DIAGNOSIS — Z9189 Other specified personal risk factors, not elsewhere classified: Secondary | ICD-10-CM | POA: Diagnosis not present

## 2024-04-05 DIAGNOSIS — R5383 Other fatigue: Secondary | ICD-10-CM

## 2024-04-05 DIAGNOSIS — R400 Somnolence: Secondary | ICD-10-CM | POA: Diagnosis not present

## 2024-04-05 DIAGNOSIS — Z6841 Body Mass Index (BMI) 40.0 and over, adult: Secondary | ICD-10-CM | POA: Diagnosis not present

## 2024-04-05 MED ORDER — TRIAMCINOLONE ACETONIDE 55 MCG/ACT NA AERO: 1.0000 | INHALATION_SPRAY | Freq: Two times a day (BID) | NASAL | 2 refills | Status: AC

## 2024-04-05 MED ORDER — PROMETHAZINE-DM 6.25-15 MG/5ML PO SYRP: 5.0000 mL | ORAL_SOLUTION | Freq: Every evening | ORAL | 0 refills | Status: AC | PRN

## 2024-04-05 MED ORDER — SALINE SPRAY 0.65 % NA SOLN: 1.0000 | NASAL | 2 refills | Status: DC | PRN

## 2024-04-05 NOTE — Progress Notes (Unsigned)
 CC: Epistaxis  HPI: Tara Branch is a 43 y.o. female living with a history stated below and presents today for epistaxis. Please see problem based assessment and plan for additional details.  Past Medical History:  Diagnosis Date   Anxiety    Arthritis    Asthma    CTS (carpal tunnel syndrome) 10/2013   left   Headache    Irregular periods    LMP 05/2013 - has had BTL   Seasonal allergies    Sleep apnea     Current Outpatient Medications on File Prior to Visit  Medication Sig Dispense Refill   amLODipine  (NORVASC ) 5 MG tablet Take 1 tablet (5 mg total) by mouth daily. 30 tablet 11   diclofenac  Sodium (VOLTAREN  ARTHRITIS PAIN) 1 % GEL Apply 4 g topically 4 (four) times daily. 150 g 2   ibuprofen  (ADVIL ) 800 MG tablet TAKE 1 TABLET BY MOUTH 2 TIMES DAILY AS NEEDED. 30 tablet 0   lidocaine  (LIDODERM ) 5 % Place 1 patch onto the skin daily. Remove & Discard patch within 12 hours or as directed by MD 30 patch 0   norethindrone-ethinyl estradiol-iron (JUNEL FE 1.5/30) 1.5-30 MG-MCG tablet Take 1 tablet by mouth daily. 84 tablet 3   semaglutide -weight management (WEGOVY ) 1.7 MG/0.75ML SOAJ SQ injection Inject 1.7 mg into the skin once a week. 3 mL 2   traZODone  (DESYREL ) 100 MG tablet Take 1 tablet (100 mg total) by mouth at bedtime as needed. for sleep 90 tablet 1   No current facility-administered medications on file prior to visit.    Family History  Problem Relation Age of Onset   Hypertension Mother    Arthritis Mother    Headache Mother    Healthy Father    Breast cancer Sister 84    Social History   Socioeconomic History   Marital status: Single    Spouse name: Not on file   Number of children: 2   Years of education: Currently 2nd yr college   Highest education level: Not on file  Occupational History   Occupation: call center   Occupation: student  Tobacco Use   Smoking status: Never   Smokeless tobacco: Never  Vaping Use   Vaping status: Never Used   Substance and Sexual Activity   Alcohol use: No   Drug use: No   Sexual activity: Not Currently    Birth control/protection: Surgical  Other Topics Concern   Not on file  Social History Narrative   Lives at home with mother, father and children.   Left-handed.   No caffeine use.   Social Drivers of Health   Financial Resource Strain: High Risk (03/15/2024)   Overall Financial Resource Strain (CARDIA)    Difficulty of Paying Living Expenses: Hard  Food Insecurity: Food Insecurity Present (03/15/2024)   Hunger Vital Sign    Worried About Running Out of Food in the Last Year: Sometimes true    Ran Out of Food in the Last Year: Sometimes true  Transportation Needs: No Transportation Needs (03/15/2024)   PRAPARE - Administrator, Civil Service (Medical): No    Lack of Transportation (Non-Medical): No  Physical Activity: Insufficiently Active (03/15/2024)   Exercise Vital Sign    Days of Exercise per Week: 2 days    Minutes of Exercise per Session: 30 min  Stress: Stress Concern Present (03/15/2024)   [employer] of Occupational Health - Occupational Stress Questionnaire    Feeling of Stress: Rather much  Social  Connections: Moderately Integrated (03/15/2024)   Social Connection and Isolation Panel    Frequency of Communication with Friends and Family: More than three times a week    Frequency of Social Gatherings with Friends and Family: More than three times a week    Attends Religious Services: 1 to 4 times per year    Active Member of [employer] or Organizations: No    Attends Banker Meetings: Never    Marital Status: Living with partner  Intimate Partner Violence: Not At Risk (03/15/2024)   Humiliation, Afraid, Rape, and Kick questionnaire    Fear of Current or Ex-Partner: No    Emotionally Abused: No    Physically Abused: No    Sexually Abused: No    Review of Systems: ROS negative except for what is noted on the assessment and plan.  Vitals:    04/05/24 1525  BP: (!) 140/78  Pulse: 97  Temp: 98.3 F (36.8 C)  TempSrc: Oral  SpO2: 100%  Weight: 232 lb 6.4 oz (105.4 kg)  Height: 5' 4 (1.626 m)   Physical Exam Constitutional:      General: She is not in acute distress.    Appearance: She is not ill-appearing.  HENT:     Nose: Congestion present. No signs of injury or nasal tenderness.     Right Nostril: No epistaxis.     Left Nostril: No epistaxis.     Right Turbinates: Enlarged and swollen.     Left Turbinates: Enlarged and swollen.     Right Sinus: No maxillary sinus tenderness or frontal sinus tenderness.     Left Sinus: No maxillary sinus tenderness or frontal sinus tenderness.  Cardiovascular:     Rate and Rhythm: Normal rate.  Pulmonary:     Effort: Pulmonary effort is normal.  Skin:    General: Skin is warm and dry.  Neurological:     Mental Status: She is alert and oriented to person, place, and time.    Assessment & Plan:   Assessment & Plan Epistaxis Acute visit for new epistaxis.  Initial episode started about 3 weeks ago that self resolved after 10 minutes with tilting head and pressure.  That week she had about 2-3 that were still self resolving.  Then did not have another episode until this week that resolved after a few minutes of pressure.  Denies any history of epistaxis or history of bleeding disorder for her or family.  Denies recent nasal injury or trauma.  Has history of nasal congestion and was recently treated at urgent care for suspected acute sinusitis.  Exam today showed enlarged swollen turbinates.  Reviewed prior CT sinus in 2017 which showed rightward septal deviation and spurring contacting the inferior turbinate which was evaluated by ENT appears to be lost to follow-up but had discussed plans of supportive care versus turbinate reduction.  Plan -Start Nasacort and saline nasal spray (stop Flonase) -Discussed Afrin to control bleeding if current supportive measures do not -Discussed  return precautions if unable to control epistaxis -Discussed referral back to ENT, patient would like to wait given epistaxis not recurring on a weekly basis, can send referral if patient prefers -Patient can complete treatment given at UC visit At risk for obstructive sleep apnea STOP-BANG score intermediate.  Endorses snoring, daytime drowsiness and fatigue.  Unclear of apneic episodes overnight.  Previously sent referral for sleep study but unable to complete.  Will send a new referral. Morbid obesity with BMI of 40.0-44.9, adult (HCC) BMI 40.  Doing well with Wegovy  and still has supplies with recent refill.  Working on evaluation for sleep apnea.  Discussed possible transition to another GLP-1 due to likely loss of coverage with Medicaid.  Continue working on lifestyle modifications including healthy eating habits and increasing physical activity.  Orders Placed This Encounter  Procedures   Ambulatory referral to Sleep Studies   Return in about 4 months (around 08/06/2024) for rountine visit.   Patient discussed with Dr. Machen  Jakobee Brackins, D.O. [facility], PGY-3 Phone: [phone] Date 04/05/2024 Time 8:25 PM

## 2024-04-05 NOTE — Patient Instructions (Addendum)
 Thank you, Ms.Odella Qua for allowing us  to provide your care today. Today we discussed:  -STOP Flonase, START Nasacort nasal spray -START saline nasal spray ([place]) into each nostril as needed  -Limit on promethazine  DM to as needed  -Consider ENT referral back if you choose that.  -Referral sent for sleep study to check for sleep apnea   I have ordered the following medication/changed the following medications:  Start the following medications: Meds ordered this encounter  Medications   triamcinolone (NASACORT) 55 MCG/ACT AERO nasal inhaler    Sig: Place 1 spray into the nose 2 (two) times daily.    Dispense:  1 each    Refill:  2   sodium chloride (OCEAN) 0.65 % SOLN nasal spray    Sig: Place 1 spray into both nostrils as needed for congestion.    Dispense:  60 mL    Refill:  2   promethazine -dextromethorphan (PROMETHAZINE -DM) 6.25-15 MG/5ML syrup    Sig: Take 5 mLs by mouth at bedtime as needed for cough.    Dispense:  118 mL    Refill:  0     Follow up: 3-4 months   Should you have any questions or concerns please call the internal medicine clinic at [phone].    Socorro Kanitz, D.O. [facility]

## 2024-04-06 ENCOUNTER — Encounter: Payer: Self-pay | Admitting: Student

## 2024-04-06 NOTE — Assessment & Plan Note (Signed)
 STOP-BANG score intermediate.  Endorses snoring, daytime drowsiness and fatigue.  Unclear of apneic episodes overnight.  Previously sent referral for sleep study but unable to complete.  Will send a new referral.

## 2024-04-06 NOTE — Assessment & Plan Note (Signed)
 Acute visit for new epistaxis.  Initial episode started about 3 weeks ago that self resolved after 10 minutes with tilting head and pressure.  That week she had about 2-3 that were still self resolving.  Then did not have another episode until this week that resolved after a few minutes of pressure.  Denies any history of epistaxis or history of bleeding disorder for her or family.  Denies recent nasal injury or trauma.  Has history of nasal congestion and was recently treated at urgent care for suspected acute sinusitis.  Exam today showed enlarged swollen turbinates.  Reviewed prior CT sinus in 2017 which showed rightward septal deviation and spurring contacting the inferior turbinate which was evaluated by ENT appears to be lost to follow-up but had discussed plans of supportive care versus turbinate reduction.  Plan -Start Nasacort and saline nasal spray (stop Flonase) -Discussed Afrin to control bleeding if current supportive measures do not -Discussed return precautions if unable to control epistaxis -Discussed referral back to ENT, patient would like to wait given epistaxis not recurring on a weekly basis, can send referral if patient prefers -Patient can complete treatment given at [facility] visit

## 2024-04-06 NOTE — Assessment & Plan Note (Addendum)
 BMI 40.  Doing well with Wegovy  and still has supplies with recent refill.  Working on evaluation for sleep apnea.  Discussed possible transition to another GLP-1 due to likely loss of coverage with Medicaid.  Continue working on lifestyle modifications including healthy eating habits and increasing physical activity.

## 2024-04-11 ENCOUNTER — Other Ambulatory Visit: Payer: Self-pay | Admitting: Student

## 2024-04-11 NOTE — Progress Notes (Signed)
 Internal Medicine Clinic Attending  Case discussed with the resident at the time of the visit.  We reviewed the resident's history and exam and pertinent patient test results.  I agree with the assessment, diagnosis, and plan of care documented in the resident's note.

## 2024-04-12 ENCOUNTER — Telehealth: Payer: Self-pay

## 2024-04-12 NOTE — Telephone Encounter (Signed)
 Prior Authorization has been submitted awaiting approval or denial.

## 2024-04-12 NOTE — Telephone Encounter (Signed)
 Date: 04/12/2024 Request #: [id number] Physician name: Ozell Nearing Office fax: [phone] Member name: Cherita Hebel Member ID: [id number] Member DOB: 1981/05/23 Drug requested: WEGOVY  1.7 MG/0.75 ML PEN Notes: This is a courtesy notification to advise you that we do not cover the above-requested  medication under this member's benefit plan.

## 2024-04-12 NOTE — Telephone Encounter (Signed)
 Prior Authorization for patient (Wegovy  1.7MG /0.75ML auto-injectors) came through on cover my meds was submitted with last office notes awaiting approval or denial.  XZB:[id number]

## 2024-04-14 ENCOUNTER — Encounter: Admitting: Orthopedic Surgery

## 2024-04-15 ENCOUNTER — Ambulatory Visit

## 2024-04-25 ENCOUNTER — Encounter: Payer: Self-pay | Admitting: Radiology

## 2024-04-26 NOTE — Telephone Encounter (Signed)
 Per message below the patient has now reconsidered her decision to see an ENT after her LOV on 04/05/2024 with you  .  Can a new Referral now be placed?   Copied from CRM [id number]. Topic: Referral - Request for Referral >> Apr 21, 2024 11:46 AM Debby BROCKS wrote: Did the patient discuss referral with their provider in the last year? Yes (If No - schedule appointment) (If Yes - send message)  Appointment offered? No  Type of order/referral and detailed reason for visit: Ear nose and throat Doctor. Patient states that she spoke with Dr. Elicia about a referral but was hesitant. However after receiving another nose bleed she would like to get the referral now   Preference of office, provider, location: N/A  If referral order, have you been seen by this specialty before? No (If Yes, this issue or another issue? When? Where?  Can we respond through MyChart? Yes

## 2024-04-26 NOTE — Addendum Note (Signed)
 Addended by: ELICIA SHARPER on: 04/26/2024 12:00 PM   Modules accepted: Orders

## 2024-04-27 ENCOUNTER — Encounter (INDEPENDENT_AMBULATORY_CARE_PROVIDER_SITE_OTHER): Payer: Self-pay

## 2024-05-04 ENCOUNTER — Encounter: Payer: Self-pay | Admitting: Neurology

## 2024-05-04 ENCOUNTER — Ambulatory Visit: Admitting: Neurology

## 2024-05-04 VITALS — BP 143/98 | HR 96 | Ht 64.0 in | Wt 230.4 lb

## 2024-05-04 DIAGNOSIS — R351 Nocturia: Secondary | ICD-10-CM | POA: Diagnosis not present

## 2024-05-04 DIAGNOSIS — E669 Obesity, unspecified: Secondary | ICD-10-CM | POA: Diagnosis not present

## 2024-05-04 DIAGNOSIS — G4719 Other hypersomnia: Secondary | ICD-10-CM

## 2024-05-04 DIAGNOSIS — R0683 Snoring: Secondary | ICD-10-CM

## 2024-05-04 DIAGNOSIS — Z9189 Other specified personal risk factors, not elsewhere classified: Secondary | ICD-10-CM

## 2024-05-04 DIAGNOSIS — R519 Headache, unspecified: Secondary | ICD-10-CM

## 2024-05-04 DIAGNOSIS — G4700 Insomnia, unspecified: Secondary | ICD-10-CM

## 2024-05-04 NOTE — Progress Notes (Signed)
 Subjective:    Patient ID: Tara Branch is a 43 y.o. female.  HPI    True Mar, MD, PhD [facility] [address] [address] [place], [place] [address]  Dear Dr. Elicia,   I saw your patient, Tara Branch, upon your kind request in my sleep clinic today for initial consultation of her sleep disorder, in particular, concern for underlying obstructive sleep apnea.  The patient is accompanied by her son today.  As you know, [name] is a 43 year old female with an underlying medical history of asthma, arthritis, headache, seasonal allergies, and obesity, who reports snoring and excessive daytime somnolence as well as chronic difficulty initiating and maintaining sleep.  Her Epworth sleepiness score is 12 out of 24, fatigue severity score is 57 out of 63.  I reviewed your office note from 04/05/2024.  She was on trazodone  for about 2 years but has not been on it for about a year as she worried about side effects.  She has been working on weight loss.  She is currently on Wegovy .  She has had nosebleeds and is waiting to get an appointment with ENT.  She lives with her 2 children and her boyfriend.  She is not aware of any family history of sleep apnea.  She has a variable sleep schedule, does not sleep on a separate schedule, does nap during the day.  She works from home.  She denies cigarette smoking, denies utilizing THC or CBD products.  She drinks alcohol twice a week, up to 2 glasses of wine, she does not drink caffeine daily.    Her Past Medical History Is Significant For: Past Medical History:  Diagnosis Date   Anxiety    Arthritis    Asthma    CTS (carpal tunnel syndrome) 10/2013   left   Headache    Irregular periods    LMP 05/2013 - has had BTL   Seasonal allergies    Sleep apnea     Her Past Surgical History Is Significant For: Past Surgical History:  Procedure Laterality Date   CARPAL TUNNEL RELEASE Right 07/11/2013   Procedure: RIGHT  CARPAL TUNNEL RELEASE;  Surgeon: Franky JONELLE Curia, MD;  Location: [facility];  Service: Orthopedics;  Laterality: Right;   CARPAL TUNNEL RELEASE Left 10/28/2013   Procedure: LEFT CARPAL TUNNEL RELEASE;  Surgeon: Franky JONELLE Curia, MD;  Location: [facility];  Service: Orthopedics;  Laterality: Left;   CESAREAN SECTION  12/12/2003   TUBAL LIGATION  05/10/2006   UMBILICAL HERNIA REPAIR N/A 10/23/2021   Procedure: OPEN UMBILICAL HERNIA REPAIR WITH MESH;  Surgeon: Vernetta Berg, MD;  Location: [facility];  Service: General;  Laterality: N/A;  LMA   WISDOM TOOTH EXTRACTION      Her Family History Is Significant For: Family History  Problem Relation Age of Onset   Migraines Mother    Hypertension Mother    Arthritis Mother    Headache Mother    Healthy Father    Migraines Sister    Breast cancer Sister 19   Migraines Other    Seizures Neg Hx    Stroke Neg Hx    Sleep apnea Neg Hx     Her Social History Is Significant For: Social History   Socioeconomic History   Marital status: Single    Spouse name: Not on file   Number of children: 2   Years of education: Currently 2nd yr college   Highest education  level: Not on file  Occupational History   Occupation: call center   Occupation: student  Tobacco Use   Smoking status: Never   Smokeless tobacco: Never  Vaping Use   Vaping status: Never Used  Substance and Sexual Activity   Alcohol use: No   Drug use: No   Sexual activity: Not Currently    Birth control/protection: Surgical  Other Topics Concern   Not on file  Social History Narrative   Lives at home with mother, father and children.   Left-handed.   No caffeine use.   Social Drivers of Health   Financial Resource Strain: High Risk (03/15/2024)   Overall Financial Resource Strain (CARDIA)    Difficulty of Paying Living Expenses: Hard  Food Insecurity: Food Insecurity Present (03/15/2024)   Hunger Vital Sign    Worried About  Running Out of Food in the Last Year: Sometimes true    Ran Out of Food in the Last Year: Sometimes true  Transportation Needs: No Transportation Needs (03/15/2024)   PRAPARE - Administrator, Civil Service (Medical): No    Lack of Transportation (Non-Medical): No  Physical Activity: Insufficiently Active (03/15/2024)   Exercise Vital Sign    Days of Exercise per Week: 2 days    Minutes of Exercise per Session: 30 min  Stress: Stress Concern Present (03/15/2024)   [employer] of Occupational Health - Occupational Stress Questionnaire    Feeling of Stress: Rather much  Social Connections: Moderately Integrated (03/15/2024)   Social Connection and Isolation Panel    Frequency of Communication with Friends and Family: More than three times a week    Frequency of Social Gatherings with Friends and Family: More than three times a week    Attends Religious Services: 1 to 4 times per year    Active Member of [employer] or Organizations: No    Attends Banker Meetings: Never    Marital Status: Living with partner    Her Allergies Are:  Allergies  Allergen Reactions   Apple Itching    Allergic to all fruits, ice burg lettuce   Cucumber Extract Itching   Eucalyptus Oil Other (See Comments)   Other Itching and Swelling    All fruit. Iceburg lettuce. Cucumbers.   Wild Lettuce Extract (Lactuca Virosa) Itching    Ice burg lettuce   Cat Dander Itching   Grass Pollen(K-O-R-T-Swt Vern) Itching   Oxycodone  Itching  :   Her Current Medications Are:  Outpatient Encounter Medications as of 05/04/2024  Medication Sig   amLODipine  (NORVASC ) 5 MG tablet Take 1 tablet (5 mg total) by mouth daily.   ibuprofen  (ADVIL ) 800 MG tablet TAKE 1 TABLET BY MOUTH 2 TIMES DAILY AS NEEDED.   norethindrone-ethinyl estradiol-iron (JUNEL FE 1.5/30) 1.5-30 MG-MCG tablet Take 1 tablet by mouth daily.   promethazine -dextromethorphan (PROMETHAZINE -DM) 6.25-15 MG/5ML syrup Take 5 mLs by mouth at  bedtime as needed for cough.   semaglutide -weight management (WEGOVY ) 1.7 MG/0.75ML SOAJ SQ injection INJECT 1.7 MG INTO THE SKIN ONCE A WEEK.   sodium chloride (OCEAN) 0.65 % SOLN nasal spray Place 1 spray into both nostrils as needed for congestion.   triamcinolone (NASACORT) 55 MCG/ACT AERO nasal inhaler Place 1 spray into the nose 2 (two) times daily.   diclofenac  Sodium (VOLTAREN  ARTHRITIS PAIN) 1 % GEL Apply 4 g topically 4 (four) times daily. (Patient not taking: Reported on 05/04/2024)   lidocaine  (LIDODERM ) 5 % Place 1 patch onto the skin daily. Remove & Discard  patch within 12 hours or as directed by MD (Patient not taking: Reported on 05/04/2024)   traZODone  (DESYREL ) 100 MG tablet Take 1 tablet (100 mg total) by mouth at bedtime as needed. for sleep (Patient not taking: Reported on 05/04/2024)   No facility-administered encounter medications on file as of 05/04/2024.  :   Review of Systems:  Out of a complete 14 point review of systems, all are reviewed and negative with the exception of these symptoms as listed below:  Review of Systems  Objective:  Neurological Exam  Physical Exam Physical Examination:   Vitals:   05/04/24 1417  BP: (!) 143/98  Pulse: 96  SpO2: 96%    General Examination: The patient is a very pleasant 43 y.o. female in no acute distress. She appears well-developed and well-nourished.   HEENT: Normocephalic, atraumatic, pupils are equal, round and reactive to light, extraocular tracking is good without limitation to gaze excursion or nystagmus noted. No photophobia.  No corrective eye glasses in place. Hearing is grossly intact.  Face is symmetric with normal facial animation. Speech is clear without dysarthria. There is no hypophonia. There is no lip, neck/head, jaw or voice tremor. Neck is supple with full range of passive and active motion. There are no carotid bruits on auscultation.  Airway/Oropharynx exam reveals: mild mouth dryness, good dental  hygiene and moderate airway crowding, due to small airway entry, tonsils about 1+ bilaterally.  Mallampati class III, neck circumference 16 inches, minimal overbite noted.  Tongue protrudes centrally and palate elevates symmetrically.  Chest: Clear to auscultation without wheezing, rhonchi or crackles noted.  Heart: S1+S2+0, regular and normal without murmurs, rubs or gallops noted.   Abdomen: Soft, non-tender and non-distended.  Extremities: There is no pitting edema in the distal lower extremities bilaterally.   Skin: Warm and dry without trophic changes noted.   Musculoskeletal: exam reveals no obvious joint deformities.   Neurologically:  Mental status: The patient is awake, alert and oriented in all 4 spheres. Her immediate and remote memory, attention, language skills and fund of knowledge are appropriate. There is no evidence of aphasia, agnosia, apraxia or anomia. Speech is clear with normal prosody and enunciation. Thought process is linear. Mood is normal and affect is normal.  Cranial nerves II - XII are as described above under HEENT exam.  Motor exam: Normal bulk, strength and tone is noted. There is no obvious action or resting tremor.  Fine motor skills and coordination: Intact grossly.  Cerebellar testing: No dysmetria or intention tremor. There is no truncal or gait ataxia.  Sensory exam: intact to light touch in the upper and lower extremities.  Gait, station and balance: She stands easily. No veering to one side is noted. No leaning to one side is noted. Posture is age-appropriate and stance is narrow based. Gait shows normal stride length and normal pace. No problems turning are noted.   Assessment and plan:  In summary, Luvada Salamone is a very pleasant 43 y.o.-year old female with an underlying medical history of asthma, arthritis, headache, seasonal allergies, and obesity, whose history and physical exam are concerning for sleep disordered breathing, particularly  obstructive sleep apnea (OSA). While a laboratory attended sleep study is typically considered gold standard for evaluation of sleep disordered breathing, she reports that she would not be able to sleep in the sleep lab and prefers a home sleep test.   I had a long chat with the patient and her son about my findings and the diagnosis of  sleep apnea, particularly OSA, its prognosis and treatment options. We talked about medical/conservative treatments, surgical interventions and non-pharmacological approaches for symptom control. I explained, in particular, the risks and ramifications of untreated moderate to severe OSA, especially with respect to developing cardiovascular disease down the road, including congestive heart failure (CHF), difficult to treat hypertension, cardiac arrhythmias (particularly A-fib), neurovascular complications including TIA, stroke and dementia. Even type 2 diabetes has, in part, been linked to untreated OSA. Symptoms of untreated OSA may include (but may not be limited to) daytime sleepiness, nocturia (i.e. frequent nighttime urination), memory problems, mood irritability and suboptimally controlled or worsening mood disorder such as depression and/or anxiety, lack of energy, lack of motivation, physical discomfort, as well as recurrent headaches, especially morning or nocturnal headaches. We talked about the importance of maintaining a healthy lifestyle and striving for healthy weight.  In addition, we talked about the importance of striving for and maintaining good sleep hygiene. I recommended a sleep study at this time. I outlined the differences between a laboratory attended sleep study which is considered more comprehensive and accurate over the option of a home sleep test (HST); the latter may lead to underestimation of sleep disordered breathing in some instances and does not help with diagnosing upper airway resistance syndrome and is not accurate enough to diagnose primary  central sleep apnea typically. I outlined possible surgical and non-surgical treatment options of OSA, including the use of a positive airway pressure (PAP) device (i.e. CPAP, AutoPAP/APAP or BiPAP in certain circumstances), a custom-made dental device (aka oral appliance, which would require a referral to a specialist dentist or orthodontist typically, and is generally speaking not considered for patients with full dentures or edentulous state), upper airway surgical options, such as traditional UPPP (which is not considered a first-line treatment) or the Inspire device (hypoglossal nerve stimulator, which would involve a referral for consultation with an ENT surgeon, after careful selection, following inclusion criteria - also not first-line treatment). I explained the PAP treatment option to the patient in detail, as this is generally considered first-line treatment.  The patient indicated that she would be willing to try PAP therapy, if the need arises. I explained the importance of being compliant with PAP treatment, not only for insurance purposes but primarily to improve patient's symptoms symptoms, and for the patient's long term health benefit, including to reduce Her cardiovascular risks longer-term.    We will pick up our discussion about the next steps and treatment options after testing.  We will keep her posted as to the test results by phone call and/or MyChart messaging where possible.  We will plan to follow-up in sleep clinic accordingly as well.  I answered all her questions today and the patient was in agreement.   I encouraged her to call with any interim questions, concerns, problems or updates or email us  through MyChart.  Generally speaking, sleep test authorizations may take up to 2 weeks, sometimes less, sometimes longer, the patient is encouraged to get in touch with us  if they do not hear back from the sleep lab staff directly within the next 2 weeks.  Thank you very much for  allowing me to participate in the care of this nice patient. If I can be of any further assistance to you please do not hesitate to call me at [phone].  Sincerely,   True Mar, MD, PhD

## 2024-05-04 NOTE — Patient Instructions (Signed)

## 2024-05-09 ENCOUNTER — Telehealth: Payer: Self-pay | Admitting: *Deleted

## 2024-05-09 DIAGNOSIS — N938 Other specified abnormal uterine and vaginal bleeding: Secondary | ICD-10-CM

## 2024-05-09 DIAGNOSIS — E282 Polycystic ovarian syndrome: Secondary | ICD-10-CM

## 2024-05-09 NOTE — Telephone Encounter (Signed)
 Will will forward to [facility] Referral Coordinator.                                        Copied from CRM #[id number]. Topic: Referral - Question >> May 09, 2024  9:49 AM Chiquita SQUIBB wrote: Reason for CRM: Patient is calling in because she was referred to [facility] and they are booked until February patient is asking to be referred to a different office that accepts her insurance. Please advice patient

## 2024-05-16 ENCOUNTER — Other Ambulatory Visit: Payer: Self-pay | Admitting: Student

## 2024-05-16 DIAGNOSIS — Z1231 Encounter for screening mammogram for malignant neoplasm of breast: Secondary | ICD-10-CM

## 2024-05-17 ENCOUNTER — Telehealth: Payer: Self-pay | Admitting: Neurology

## 2024-05-17 ENCOUNTER — Other Ambulatory Visit: Payer: Self-pay

## 2024-05-17 DIAGNOSIS — M65311 Trigger thumb, right thumb: Secondary | ICD-10-CM

## 2024-05-17 NOTE — Telephone Encounter (Signed)
 HST [facility] no auth req via fax

## 2024-05-17 NOTE — Telephone Encounter (Signed)
 HST [facility] pending

## 2024-05-24 NOTE — Addendum Note (Signed)
 Addended by: ELICIA SHARPER on: 05/24/2024 05:00 PM   Modules accepted: Orders

## 2024-05-27 ENCOUNTER — Encounter (HOSPITAL_BASED_OUTPATIENT_CLINIC_OR_DEPARTMENT_OTHER): Payer: Self-pay | Admitting: Orthopedic Surgery

## 2024-05-27 ENCOUNTER — Other Ambulatory Visit: Payer: Self-pay

## 2024-06-01 ENCOUNTER — Encounter (HOSPITAL_BASED_OUTPATIENT_CLINIC_OR_DEPARTMENT_OTHER)
Admission: RE | Admit: 2024-06-01 | Discharge: 2024-06-01 | Disposition: A | Discharge status: Home / Self Care | Source: Ambulatory Visit | Attending: Orthopedic Surgery | Admitting: Orthopedic Surgery

## 2024-06-01 ENCOUNTER — Institutional Professional Consult (permissible substitution) (INDEPENDENT_AMBULATORY_CARE_PROVIDER_SITE_OTHER)

## 2024-06-01 DIAGNOSIS — I10 Essential (primary) hypertension: Secondary | ICD-10-CM | POA: Diagnosis not present

## 2024-06-01 DIAGNOSIS — Z01810 Encounter for preprocedural cardiovascular examination: Secondary | ICD-10-CM | POA: Diagnosis not present

## 2024-06-01 NOTE — Progress Notes (Signed)

## 2024-06-03 ENCOUNTER — Other Ambulatory Visit: Payer: Self-pay

## 2024-06-03 ENCOUNTER — Ambulatory Visit (HOSPITAL_BASED_OUTPATIENT_CLINIC_OR_DEPARTMENT_OTHER)
Admission: RE | Admit: 2024-06-03 | Discharge: 2024-06-03 | Disposition: A | Attending: Orthopedic Surgery | Admitting: Orthopedic Surgery

## 2024-06-03 ENCOUNTER — Encounter (HOSPITAL_BASED_OUTPATIENT_CLINIC_OR_DEPARTMENT_OTHER)
Admission: RE | Disposition: A | Discharge status: Home / Self Care | Payer: Self-pay | Source: Home / Self Care | Attending: Orthopedic Surgery

## 2024-06-03 ENCOUNTER — Telehealth: Payer: Self-pay

## 2024-06-03 ENCOUNTER — Ambulatory Visit (HOSPITAL_BASED_OUTPATIENT_CLINIC_OR_DEPARTMENT_OTHER): Admitting: Anesthesiology

## 2024-06-03 ENCOUNTER — Encounter (HOSPITAL_BASED_OUTPATIENT_CLINIC_OR_DEPARTMENT_OTHER): Payer: Self-pay | Admitting: Orthopedic Surgery

## 2024-06-03 DIAGNOSIS — Z8261 Family history of arthritis: Secondary | ICD-10-CM | POA: Diagnosis not present

## 2024-06-03 DIAGNOSIS — J45909 Unspecified asthma, uncomplicated: Secondary | ICD-10-CM | POA: Diagnosis not present

## 2024-06-03 DIAGNOSIS — Z79899 Other long term (current) drug therapy: Secondary | ICD-10-CM | POA: Diagnosis not present

## 2024-06-03 DIAGNOSIS — M65311 Trigger thumb, right thumb: Secondary | ICD-10-CM | POA: Diagnosis not present

## 2024-06-03 DIAGNOSIS — I10 Essential (primary) hypertension: Secondary | ICD-10-CM

## 2024-06-03 DIAGNOSIS — Z01818 Encounter for other preprocedural examination: Secondary | ICD-10-CM

## 2024-06-03 DIAGNOSIS — M1990 Unspecified osteoarthritis, unspecified site: Secondary | ICD-10-CM | POA: Diagnosis not present

## 2024-06-03 DIAGNOSIS — G4730 Sleep apnea, unspecified: Secondary | ICD-10-CM | POA: Diagnosis not present

## 2024-06-03 DIAGNOSIS — Z59868 Other specified financial insecurity: Secondary | ICD-10-CM | POA: Diagnosis not present

## 2024-06-03 DIAGNOSIS — Z8249 Family history of ischemic heart disease and other diseases of the circulatory system: Secondary | ICD-10-CM | POA: Diagnosis not present

## 2024-06-03 DIAGNOSIS — F419 Anxiety disorder, unspecified: Secondary | ICD-10-CM | POA: Diagnosis not present

## 2024-06-03 HISTORY — PX: TRIGGER FINGER RELEASE: SHX641

## 2024-06-03 LAB — POCT PREGNANCY, URINE: Preg Test, Ur: NEGATIVE

## 2024-06-03 SURGERY — RELEASE, A1 PULLEY, FOR TRIGGER FINGER
Anesthesia: Monitor Anesthesia Care | Site: Thumb | Laterality: Right

## 2024-06-03 MED ORDER — ONDANSETRON HCL 4 MG/2ML IJ SOLN
INTRAMUSCULAR | Status: AC
  Filled 2024-06-03: qty 2

## 2024-06-03 MED ORDER — FENTANYL CITRATE (PF) 100 MCG/2ML IJ SOLN
INTRAMUSCULAR | Status: AC
  Filled 2024-06-03: qty 2

## 2024-06-03 MED ORDER — FENTANYL CITRATE (PF) 100 MCG/2ML IJ SOLN
50.0000 ug | Freq: Once | INTRAMUSCULAR | Status: AC
  Administered 2024-06-03: 50 ug via INTRAVENOUS

## 2024-06-03 MED ORDER — DEXMEDETOMIDINE HCL IN NACL 80 MCG/20ML IV SOLN
INTRAVENOUS | Status: DC | PRN
  Administered 2024-06-03 (×2): 4 ug via INTRAVENOUS

## 2024-06-03 MED ORDER — LIDOCAINE 2% (20 MG/ML) 5 ML SYRINGE
INTRAMUSCULAR | Status: AC
  Filled 2024-06-03: qty 5

## 2024-06-03 MED ORDER — PROPOFOL 10 MG/ML IV BOLUS
INTRAVENOUS | Status: AC
  Filled 2024-06-03: qty 20

## 2024-06-03 MED ORDER — CELECOXIB 200 MG PO CAPS
200.0000 mg | ORAL_CAPSULE | Freq: Once | ORAL | Status: AC
  Administered 2024-06-03: 200 mg via ORAL

## 2024-06-03 MED ORDER — ACETAMINOPHEN 500 MG PO TABS
1000.0000 mg | ORAL_TABLET | Freq: Once | ORAL | Status: AC
  Administered 2024-06-03: 1000 mg via ORAL

## 2024-06-03 MED ORDER — LIDOCAINE HCL (CARDIAC) PF 100 MG/5ML IV SOSY
PREFILLED_SYRINGE | INTRAVENOUS | Status: DC | PRN
  Administered 2024-06-03: 60 mg via INTRAVENOUS

## 2024-06-03 MED ORDER — DEXAMETHASONE SODIUM PHOSPHATE 4 MG/ML IJ SOLN
INTRAMUSCULAR | Status: DC | PRN
  Administered 2024-06-03: 5 mg via INTRAVENOUS

## 2024-06-03 MED ORDER — ONDANSETRON HCL 4 MG/2ML IJ SOLN
INTRAMUSCULAR | Status: DC | PRN
  Administered 2024-06-03: 4 mg via INTRAVENOUS

## 2024-06-03 MED ORDER — LIDOCAINE HCL 1 % IJ SOLN
INTRAMUSCULAR | Status: DC | PRN
  Administered 2024-06-03: 10 mL

## 2024-06-03 MED ORDER — MIDAZOLAM HCL 2 MG/2ML IJ SOLN
INTRAMUSCULAR | Status: AC
  Filled 2024-06-03: qty 2

## 2024-06-03 MED ORDER — PROPOFOL 10 MG/ML IV BOLUS
INTRAVENOUS | Status: DC | PRN
  Administered 2024-06-03 (×2): 20 mg via INTRAVENOUS

## 2024-06-03 MED ORDER — MIDAZOLAM HCL 5 MG/5ML IJ SOLN
INTRAMUSCULAR | Status: DC | PRN
  Administered 2024-06-03: 2 mg via INTRAVENOUS

## 2024-06-03 MED ORDER — KETOROLAC TROMETHAMINE 30 MG/ML IJ SOLN
30.0000 mg | Freq: Four times a day (QID) | INTRAMUSCULAR | Status: DC | PRN
  Administered 2024-06-03: 30 mg via INTRAVENOUS

## 2024-06-03 MED ORDER — DEXMEDETOMIDINE HCL IN NACL 80 MCG/20ML IV SOLN
INTRAVENOUS | Status: AC
  Filled 2024-06-03: qty 20

## 2024-06-03 MED ORDER — DROPERIDOL 2.5 MG/ML IJ SOLN: 0.6250 mg | Freq: Once | INTRAMUSCULAR | Status: DC | PRN

## 2024-06-03 MED ORDER — CEFAZOLIN SODIUM-DEXTROSE 2-4 GM/100ML-% IV SOLN
2.0000 g | INTRAVENOUS | Status: AC
  Administered 2024-06-03: 2 g via INTRAVENOUS

## 2024-06-03 MED ORDER — LACTATED RINGERS IV SOLN: INTRAVENOUS | Status: DC

## 2024-06-03 MED ORDER — FENTANYL CITRATE (PF) 100 MCG/2ML IJ SOLN
25.0000 ug | INTRAMUSCULAR | Status: DC | PRN
  Administered 2024-06-03 (×2): 50 ug via INTRAVENOUS

## 2024-06-03 MED ORDER — PROPOFOL 500 MG/50ML IV EMUL
INTRAVENOUS | Status: DC | PRN
  Administered 2024-06-03: 75 ug/kg/min via INTRAVENOUS

## 2024-06-03 MED ORDER — ACETAMINOPHEN-CODEINE 300-30 MG PO TABS: 1.0000 | ORAL_TABLET | Freq: Four times a day (QID) | ORAL | 0 refills | Status: DC | PRN

## 2024-06-03 MED ORDER — KETOROLAC TROMETHAMINE 30 MG/ML IJ SOLN
INTRAMUSCULAR | Status: AC
  Filled 2024-06-03: qty 1

## 2024-06-03 MED ORDER — OXYCODONE HCL 5 MG PO TABS
ORAL_TABLET | ORAL | Status: AC
  Filled 2024-06-03: qty 1

## 2024-06-03 MED ORDER — FENTANYL CITRATE (PF) 100 MCG/2ML IJ SOLN
INTRAMUSCULAR | Status: DC | PRN
  Administered 2024-06-03 (×4): 25 ug via INTRAVENOUS

## 2024-06-03 MED FILL — Celecoxib Cap 200 MG: ORAL | Qty: 1 | Status: AC

## 2024-06-03 MED FILL — Cefazolin Sodium-Dextrose IV Solution 2 GM/100ML-4%: INTRAVENOUS | Qty: 100 | Status: AC

## 2024-06-03 MED FILL — Lidocaine HCl Local Soln Prefilled Syringe 100 MG/5ML (2%): INTRAMUSCULAR | Qty: 5 | Status: AC

## 2024-06-03 MED FILL — Acetaminophen Tab 500 MG: ORAL | Qty: 2 | Status: AC

## 2024-06-03 SURGICAL SUPPLY — 28 items
BLADE MINI RND TIP GREEN BEAV (BLADE) ×1 IMPLANT
BLADE SURG 15 STRL LF DISP TIS (BLADE) ×2 IMPLANT
BNDG COHESIVE 4X5 TAN STRL LF (GAUZE/BANDAGES/DRESSINGS) ×1 IMPLANT
BNDG COMPR ESMARK 4X3 LF (GAUZE/BANDAGES/DRESSINGS) ×1 IMPLANT
BNDG ELASTIC 3INX 5YD STR LF (GAUZE/BANDAGES/DRESSINGS) ×1 IMPLANT
BNDG ELASTIC 4INX 5YD STR LF (GAUZE/BANDAGES/DRESSINGS) ×1 IMPLANT
CHLORAPREP W/TINT 26 (MISCELLANEOUS) ×1 IMPLANT
CORD BIPOLAR FORCEPS 12FT (ELECTRODE) ×1 IMPLANT
COVER BACK TABLE 60X90IN (DRAPES) ×1 IMPLANT
CUFF TOURN SGL QUICK 18X4 (TOURNIQUET CUFF) ×1 IMPLANT
DRAPE HAND 77X146 (DRAPES) ×1 IMPLANT
GAUZE SPONGE 4X4 12PLY STRL (GAUZE/BANDAGES/DRESSINGS) ×1 IMPLANT
GAUZE STRETCH 2X75IN STRL (MISCELLANEOUS) ×1 IMPLANT
GAUZE XEROFORM 1X8 LF (GAUZE/BANDAGES/DRESSINGS) ×1 IMPLANT
GLOVE BIO SURGEON STRL SZ7.5 (GLOVE) ×2 IMPLANT
GLOVE BIOGEL PI IND STRL 7.5 (GLOVE) ×1 IMPLANT
GOWN STRL REUS W/ TWL LRG LVL3 (GOWN DISPOSABLE) ×2 IMPLANT
GOWN STRL REUS W/TWL XL LVL3 (GOWN DISPOSABLE) IMPLANT
GOWN STRL SURGICAL XL XLNG (GOWN DISPOSABLE) ×1 IMPLANT
NDL HYPO 25X5/8 SAFETYGLIDE (NEEDLE) IMPLANT
PACK BASIN DAY SURGERY FS (CUSTOM PROCEDURE TRAY) ×1 IMPLANT
SHEET MEDIUM DRAPE 40X70 STRL (DRAPES) ×1 IMPLANT
SOLN 0.9% NACL POUR BTL 1000ML (IV SOLUTION) IMPLANT
STOCKINETTE IMPERVIOUS 9X36 MD (GAUZE/BANDAGES/DRESSINGS) ×1 IMPLANT
SUT ETHILON 4 0 PS 2 18 (SUTURE) ×1 IMPLANT
SYR BULB EAR ULCER 3OZ GRN STR (SYRINGE) ×2 IMPLANT
SYR CONTROL 10ML LL (SYRINGE) ×1 IMPLANT
TOWEL GREEN STERILE FF (TOWEL DISPOSABLE) ×2 IMPLANT

## 2024-06-03 NOTE — Transfer of Care (Signed)
 Immediate Anesthesia Transfer of Care Note  Patient: Tara Branch  Procedure(s) Performed: RELEASE, A1 PULLEY, FOR TRIGGER FINGER (Right: Thumb)  Patient Location: PACU  Anesthesia Type:MAC  Level of Consciousness: awake and alert   Airway & Oxygen Therapy: Patient Spontanous Breathing and Patient connected to face mask oxygen  Post-op Assessment: Report given to RN and Post -op Vital signs reviewed and stable  Post vital signs: Reviewed and stable  Last Vitals:  Vitals Value Taken Time  BP 119/78 06/03/24 10:30  Temp    Pulse 98 06/03/24 10:30  Resp 19 06/03/24 10:30  SpO2 96 % 06/03/24 10:30  Vitals shown include unfiled device data.  Last Pain:  Vitals:   06/03/24 0932  TempSrc: Temporal  PainSc: 0-No pain         Complications: No notable events documented.

## 2024-06-03 NOTE — Discharge Instructions (Addendum)
° ° °  Hand Surgery Postop Instructions    Dressings: Maintain postoperative dressing for 5 days.   After 5 days, it is okay to unwrap postoperative dressing and apply Band-Aid or rewrap.   Keep operative site clean and dry until orthopedic follow-up.  Wound Care: Keep your hand elevated above the level of your heart.  Do not allow it to dangle by your side. Moving your fingers is advised to stimulate circulation but will depend on the site of your surgery.  If you have a splint applied, your doctor will advise you regarding movement.  Activity: Do not drive or operate machinery until clearance given from physician. No heavy lifting with operative extremity.  Diet:  Drink liquids today or eat a light diet.  You may resume a regular diet tomorrow.    General expectations: Pain for two to three days. Take prescribed medication if given, transition to over-the-counter medication as quickly as possible. Fingers may become slightly swollen.  Call your doctor if any of the following occur: Severe pain not relieved by pain medication. Elevated temperature. Dressing soaked with blood. Inability to move fingers. White or bluish color to fingers.   Per [facility] clinic policy, our goal is ensure optimal postoperative pain control with a multimodal pain management strategy. For all [facility] patients, our goal is to wean post-operative narcotic medications by 6 weeks post-operatively. If this is not possible due to utilization of pain medication prior to surgery, your [facility] doctor will support your acute post-operative pain control for the first 6 weeks postoperatively, with a plan to transition you back to your primary pain team following that. Maralee will work to ensure a therapist, occupational.  Anshul Afton Alderton, M.D. Hand Surgery [facility]  NO TYLENOL  UNTIL AFTER 1:30PM   Post Anesthesia Home Care Instructions  Activity: Get plenty of rest for the remainder of the  day. A responsible individual must stay with you for 24 hours following the procedure.  For the next 24 hours, DO NOT: -Drive a car -Advertising copywriter -Drink alcoholic beverages -Take any medication unless instructed by your physician -Make any legal decisions or sign important papers.  Meals: Start with liquid foods such as gelatin or soup. Progress to regular foods as tolerated. Avoid greasy, spicy, heavy foods. If nausea and/or vomiting occur, drink only clear liquids until the nausea and/or vomiting subsides. Call your physician if vomiting continues.  Special Instructions/Symptoms: Your throat may feel dry or sore from the anesthesia or the breathing tube placed in your throat during surgery. If this causes discomfort, gargle with warm salt water. The discomfort should disappear within 24 hours.  If you had a scopolamine patch placed behind your ear for the management of post- operative nausea and/or vomiting:  1. The medication in the patch is effective for 72 hours, after which it should be removed.  Wrap patch in a tissue and discard in the trash. Wash hands thoroughly with soap and water. 2. You may remove the patch earlier than 72 hours if you experience unpleasant side effects which may include dry mouth, dizziness or visual disturbances. 3. Avoid touching the patch. Wash your hands with soap and water after contact with the patch.

## 2024-06-03 NOTE — Anesthesia Postprocedure Evaluation (Signed)
 Anesthesia Post Note  Patient: Tara Branch  Procedure(s) Performed: RELEASE, A1 PULLEY, FOR TRIGGER FINGER (Right: Thumb)     Patient location during evaluation: Phase II Anesthesia Type: MAC Level of consciousness: awake and alert, oriented and patient cooperative Pain management: pain level controlled Vital Signs Assessment: post-procedure vital signs reviewed and stable Respiratory status: spontaneous breathing, nonlabored ventilation and respiratory function stable Cardiovascular status: stable and blood pressure returned to baseline Postop Assessment: no apparent nausea or vomiting and able to ambulate Anesthetic complications: no   No notable events documented.  Last Vitals:  Vitals:   06/03/24 1100 06/03/24 1115  BP: 113/81 120/77  Pulse: 83 76  Resp: 16 16  Temp:    SpO2: 96% 96%    Last Pain:  Vitals:   06/03/24 1115  TempSrc:   PainSc: 9                  Griffon Herberg,E. Vanice Rappa

## 2024-06-03 NOTE — Op Note (Signed)
 NAME: Tara Branch MEDICAL RECORD NO: [id number] DATE OF BIRTH: 1980/07/07 FACILITY: [facility] LOCATION: [facility] PHYSICIAN: Bassy Fetterly, MD   OPERATIVE REPORT   DATE OF PROCEDURE: 06/03/2024    PREOPERATIVE DIAGNOSIS: Right thumb trigger digit   POSTOPERATIVE DIAGNOSIS: Right thumb trigger digit   PROCEDURE: Right thumb trigger digit release   SURGEON:  Gildardo Alderton, M.D.   ASSISTANT: Joesph Dinsmore, OPA   ANESTHESIA:  Local with sedation   INTRAVENOUS FLUIDS:  Per anesthesia flow sheet.   ESTIMATED BLOOD LOSS:  Minimal.   COMPLICATIONS:  None.   SPECIMENS:  none   TOURNIQUET TIME:    Total Tourniquet Time Documented: Upper Arm (Right) - 7 minutes Total: Upper Arm (Right) - 7 minutes    DISPOSITION:  Stable to PACU.   INDICATIONS: 43 year old female seen in the outpatient setting by to have right thumb trigger digit refractory to conservative care.  Patient was indicated for right thumb trigger digit release.  Risks and benefits of surgery were discussed including the risks of infection, bleeding, scarring, stiffness, nerve injury, vascular injury, tendon injury, need for subsequent operation, persistent symptoms, recurrence.  She voiced understanding of these risks and elected to proceed.  OPERATIVE COURSE: Patient was seen and identified in the preoperative area and marked appropriately.  Surgical consent had been signed. Preoperative IV antibiotic prophylaxis was given. She was transferred to the operating room and placed in supine position with the Right upper extremity on an arm board.  Sedation was induced by the anesthesiologist.  Right upper extremity was prepped and draped in normal sterile orthopedic fashion.  A surgical pause was performed between the surgeons, anesthesia, and operating room staff and all were in agreement as to the patient, procedure, and site of procedure.  Tourniquet was placed and padded appropriately to the right  upper arm.  10 cc of 1% lidocaine  plain was utilized around the planned incisional site.  A 2 cm transverse incision was designed at the base of the thumb.  This incision was carried down to the subcutaneous tissues.  The A1 pulley was identified and sharply divided utilizing a Beaver blade.  Tenotomy scissors were then used to confirm complete release of the A1 pulley.   Smooth gliding to the tendon surface was noted.  The adherent flexor tenosynovial tissues were identified, sharply divided and sharply excised.    Tourniquet was subsequently deflated, bipolar electrocautery was utilized for hemostasis.  Copious irrigation was performed.  Wound was closed utilizing 4-0 nylon in horizontal mattress fashion.  The tourniquet was deflated at 7 minutes.  Fingertips were pink with brisk capillary refill after deflation of tourniquet.  The operative drapes were broken down.  The patient was awoken from anesthesia safely and taken to PACU in stable condition.   Post-operative plan: The patient will recover in the post-anesthesia care unit and then be discharged home.  The patient will be non weight bearing on the right upper extremity in a soft dressing.   I will see the patient back in the office in 2 weeks for postoperative followup.    Maximiliano Cromartie, MD Electronically signed, 06/03/2024

## 2024-06-03 NOTE — Anesthesia Preprocedure Evaluation (Addendum)
 Anesthesia Evaluation  Patient identified by MRN, date of birth, ID band Patient awake    Reviewed: Allergy & Precautions, NPO status , Patient's Chart, lab work & pertinent test results  Airway Mallampati: II  TM Distance: >3 FB Neck ROM: Full    Dental no notable dental hx.    Pulmonary asthma , sleep apnea    Pulmonary exam normal        Cardiovascular hypertension, Pt. on medications  Rhythm:Regular Rate:Normal     Neuro/Psych  Headaches  Anxiety        GI/Hepatic negative GI ROS, Neg liver ROS,,,  Endo/Other  negative endocrine ROS    Renal/GU negative Renal ROS  negative genitourinary   Musculoskeletal  (+) Arthritis , Osteoarthritis,    Abdominal Normal abdominal exam  (+)   Peds  Hematology Lab Results      Component                Value               Date                      WBC                      7.9                 03/15/2024                HGB                      11.5                03/15/2024                HCT                      36.4                03/15/2024                MCV                      87                  03/15/2024                PLT                      403                 03/15/2024              Anesthesia Other Findings   Reproductive/Obstetrics                              Anesthesia Physical Anesthesia Plan  ASA: 2  Anesthesia Plan: MAC   Post-op Pain Management:    Induction: Intravenous  PONV Risk Score and Plan: 2 and Propofol  infusion and Treatment may vary due to age or medical condition  Airway Management Planned: Simple Face Mask and Nasal Cannula  Additional Equipment: None  Intra-op Plan:   Post-operative Plan:   Informed Consent: I have reviewed the patients History and Physical, chart, labs and discussed the procedure including the risks, benefits and alternatives for the proposed anesthesia with  the patient or authorized  representative who has indicated his/her understanding and acceptance.     Dental advisory given  Plan Discussed with: CRNA  Anesthesia Plan Comments:         Anesthesia Quick Evaluation

## 2024-06-03 NOTE — Anesthesia Procedure Notes (Signed)
 Procedure Name: MAC Date/Time: 06/03/2024 9:56 AM  Performed by: Pam Macario BROCKS, CRNAPre-anesthesia Checklist: Timeout performed, Patient being monitored, Suction available, Emergency Drugs available and Patient identified Patient Re-evaluated:Patient Re-evaluated prior to induction Oxygen Delivery Method: Simple face mask Preoxygenation: Pre-oxygenation with 100% oxygen Induction Type: IV induction Placement Confirmation: breath sounds checked- equal and bilateral, CO2 detector and positive ETCO2

## 2024-06-03 NOTE — H&P (Signed)
 @LOGODEPT @  Anouk Critzer - 43 y.o. female MRN [id number]  Date of birth: [birth year]   HAND SURGERY H&P UPDATE   HPI: Patient is a 43 y.o. female who presents with right thumb trigger digit refractory to conservative care.  Patient denies any changes to their medical history or new systemic symptoms today.    Past Medical History:  Diagnosis Date   Anxiety    Arthritis    Asthma    CTS (carpal tunnel syndrome) 10/2013   left   Headache    Irregular periods    LMP 05/2013 - has had BTL   Seasonal allergies    Sleep apnea    Past Surgical History:  Procedure Laterality Date   CARPAL TUNNEL RELEASE Right 07/11/2013   Procedure: RIGHT CARPAL TUNNEL RELEASE;  Surgeon: Franky JONELLE Curia, MD;  Location: [facility];  Service: Orthopedics;  Laterality: Right;   CARPAL TUNNEL RELEASE Left 10/28/2013   Procedure: LEFT CARPAL TUNNEL RELEASE;  Surgeon: Franky JONELLE Curia, MD;  Location: [facility];  Service: Orthopedics;  Laterality: Left;   CESAREAN SECTION  12/12/2003   TUBAL LIGATION  05/10/2006   UMBILICAL HERNIA REPAIR N/A 10/23/2021   Procedure: OPEN UMBILICAL HERNIA REPAIR WITH MESH;  Surgeon: Vernetta Berg, MD;  Location: [facility];  Service: General;  Laterality: N/A;  LMA   WISDOM TOOTH EXTRACTION     Social History   Socioeconomic History   Marital status: Single    Spouse name: Not on file   Number of children: 2   Years of education: Currently 2nd yr college   Highest education level: Not on file  Occupational History   Occupation: call center   Occupation: student  Tobacco Use   Smoking status: Never   Smokeless tobacco: Never  Vaping Use   Vaping status: Never Used  Substance and Sexual Activity   Alcohol use: No   Drug use: No   Sexual activity: Not Currently    Birth control/protection: Surgical  Other Topics Concern   Not on file  Social History Narrative   Lives at home with mother, father and children.    Left-handed.   No caffeine use.   Social Drivers of Health   Tobacco Use: Low Risk (06/03/2024)   Patient History    Smoking Tobacco Use: Never    Smokeless Tobacco Use: Never    Passive Exposure: Not on file  Financial Resource Strain: High Risk (03/15/2024)   Overall Financial Resource Strain (CARDIA)    Difficulty of Paying Living Expenses: Hard  Food Insecurity: Food Insecurity Present (03/15/2024)   Epic    Worried About Programme Researcher, Broadcasting/film/video in the Last Year: Sometimes true    Ran Out of Food in the Last Year: Sometimes true  Transportation Needs: No Transportation Needs (03/15/2024)   Epic    Lack of Transportation (Medical): No    Lack of Transportation (Non-Medical): No  Physical Activity: Insufficiently Active (03/15/2024)   Exercise Vital Sign    Days of Exercise per Week: 2 days    Minutes of Exercise per Session: 30 min  Stress: Stress Concern Present (03/15/2024)   [employer] of Occupational Health - Occupational Stress Questionnaire    Feeling of Stress: Rather much  Social Connections: Moderately Integrated (03/15/2024)   Social Connection and Isolation Panel    Frequency of Communication with Friends and Family: More than three times a week    Frequency of Social Gatherings with Friends and Family: More  than three times a week    Attends Religious Services: 1 to 4 times per year    Active Member of Clubs or Organizations: No    Attends Banker Meetings: Never    Marital Status: Living with partner  Depression (PHQ2-9): Medium Risk (03/15/2024)   Depression (PHQ2-9)    PHQ-2 Score: 10  Alcohol Screen: Low Risk (03/15/2024)   Alcohol Screen    Last Alcohol Screening Score (AUDIT): 2  Housing: Unknown (03/15/2024)   Epic    Unable to Pay for Housing in the Last Year: Patient declined    Number of Times Moved in the Last Year: 0    Homeless in the Last Year: No  Utilities: Not At Risk (03/15/2024)   Epic    Threatened with loss of utilities: No   Health Literacy: Adequate Health Literacy (03/15/2024)   B1300 Health Literacy    Frequency of need for help with medical instructions: Never   Family History  Problem Relation Age of Onset   Migraines Mother    Hypertension Mother    Arthritis Mother    Headache Mother    Healthy Father    Migraines Sister    Breast cancer Sister 64   Migraines Other    Seizures Neg Hx    Stroke Neg Hx    Sleep apnea Neg Hx    - negative except otherwise stated in the family history section Allergies[1] Prior to Admission medications  Medication Sig Start Date End Date Taking? Authorizing Provider  amLODipine  (NORVASC ) 5 MG tablet Take 1 tablet (5 mg total) by mouth daily. 03/15/24 03/15/25 Yes Zheng, Michael, DO  norethindrone-ethinyl estradiol-iron (JUNEL FE 1.5/30) 1.5-30 MG-MCG tablet Take 1 tablet by mouth daily. 03/15/24  Yes Zheng, Michael, DO  ibuprofen  (ADVIL ) 800 MG tablet TAKE 1 TABLET BY MOUTH 2 TIMES DAILY AS NEEDED. 10/14/23   Zheng, Michael, DO  promethazine -dextromethorphan (PROMETHAZINE -DM) 6.25-15 MG/5ML syrup Take 5 mLs by mouth at bedtime as needed for cough. 04/05/24   Elicia Sharper, DO  semaglutide -weight management (WEGOVY ) 1.7 MG/0.75ML SOAJ SQ injection INJECT 1.7 MG INTO THE SKIN ONCE A WEEK. Patient not taking: Reported on 05/27/2024 04/12/24   Zheng, Michael, DO  triamcinolone  (NASACORT ) 55 MCG/ACT AERO nasal inhaler Place 1 spray into the nose 2 (two) times daily. 04/05/24 04/05/25  Zheng, Michael, DO   No results found. - Positive ROS: All other systems have been reviewed and were otherwise negative with the exception of those mentioned in the HPI and as above.  Physical Exam: General: No acute distress, resting comfortably Cardiovascular: BUE warm and well perfused, normal rate Respiratory: Normal WOB on RA Skin: Warm and dry Neurologic: Sensation intact distally Psychiatric: Patient is at baseline mood and affect  Right Upper Extremity  Right hand: - Palpable  nodule at the A1 pulley of the thumb, associated tenderness - Notable clicking with deep flexion of the thumb finger, range of motion at the IP joint is limited, 0-35, slightly improved passively, there is evidence of locking with deep flexion - Sensation intact distally, hand remains warm well-perfused   Assessment/Plan: OR today for right thumb trigger digit release. We again reviewed the risks of surgery which include bleeding, infection, damage to neurovascular structures, persistent symptoms, need for additional surgery.  Informed consent was signed.  All questions were answered.   [facility], Hand Surgery      [1]  Allergies Allergen Reactions   Apple Itching    Allergic to all fruits, ice  burg lettuce   Cucumber Extract Itching   Eucalyptus Oil Other (See Comments)   Other Itching and Swelling    All fruit. Iceburg lettuce. Cucumbers.   Wild Lettuce Extract (Lactuca Virosa) Itching    Ice burg lettuce   Cat Dander Itching   Grass Pollen(K-O-R-T-Swt Vern) Itching   Oxycodone  Itching

## 2024-06-03 NOTE — Telephone Encounter (Signed)
 Patient called concerning Rx for Tylenol  #3 and stating that she is starting to get nauseous.  CB# [phone].  Please advise.  Thank you.

## 2024-06-04 ENCOUNTER — Encounter (HOSPITAL_BASED_OUTPATIENT_CLINIC_OR_DEPARTMENT_OTHER): Payer: Self-pay | Admitting: Orthopedic Surgery

## 2024-06-05 ENCOUNTER — Other Ambulatory Visit: Payer: Self-pay | Admitting: Orthopedic Surgery

## 2024-06-05 MED ORDER — TRAMADOL HCL 50 MG PO TABS: 50.0000 mg | ORAL_TABLET | Freq: Four times a day (QID) | ORAL | 0 refills | Status: AC | PRN

## 2024-06-06 NOTE — Telephone Encounter (Signed)
 New RX called in. Tramadol 

## 2024-06-08 ENCOUNTER — Ambulatory Visit

## 2024-06-08 DIAGNOSIS — R0683 Snoring: Secondary | ICD-10-CM

## 2024-06-08 DIAGNOSIS — G4719 Other hypersomnia: Secondary | ICD-10-CM

## 2024-06-08 DIAGNOSIS — R519 Headache, unspecified: Secondary | ICD-10-CM

## 2024-06-08 DIAGNOSIS — E669 Obesity, unspecified: Secondary | ICD-10-CM

## 2024-06-08 DIAGNOSIS — G4733 Obstructive sleep apnea (adult) (pediatric): Secondary | ICD-10-CM

## 2024-06-08 DIAGNOSIS — G4700 Insomnia, unspecified: Secondary | ICD-10-CM

## 2024-06-08 DIAGNOSIS — Z9189 Other specified personal risk factors, not elsewhere classified: Secondary | ICD-10-CM

## 2024-06-08 DIAGNOSIS — R351 Nocturia: Secondary | ICD-10-CM

## 2024-06-09 NOTE — Progress Notes (Unsigned)
 SABRA

## 2024-06-10 ENCOUNTER — Ambulatory Visit

## 2024-06-13 ENCOUNTER — Ambulatory Visit: Payer: Self-pay | Admitting: Neurology

## 2024-06-13 DIAGNOSIS — R0683 Snoring: Secondary | ICD-10-CM

## 2024-06-13 NOTE — Procedures (Signed)
"  ° °  [facility]  HOME SLEEP TEST (Watch PAT) REPORT  STUDY DATE: 06/08/2024  DOB: [birth year]  MRN: [id number]  ORDERING CLINICIAN: True Mar, MD, PhD   REFERRING CLINICIAN: Elicia Sharper, DO   CLINICAL INFORMATION/HISTORY (obtained from visit note dated 05/04/2024):  43 year old female with an underlying medical history of asthma, arthritis, headache, seasonal allergies, and obesity, who reports snoring and excessive daytime somnolence as well as chronic difficulty initiating and maintaining sleep.    Epworth sleepiness score: 12/24.  BMI: 39.6 kg/m  FINDINGS:   Sleep Summary:   Total Recording Time (hours, min): 7 hours, 6 min  Total Sleep Time (hours, min):  4 hours, 13 min  Percent REM (%):    19.1%   Respiratory Indices:   Calculated pAHI (per hour):  4.5/hour         REM pAHI:    17.6/hour       NREM pAHI: 1.2/hour  Central pAHI: 0/hour  Oxygen Saturation Statistics:    Oxygen Saturation (%) Mean: 94%   Minimum oxygen saturation (%):                 86%   O2 Saturation Range (%): 86-98%    O2 Saturation (minutes) <=88%: 0.5 min  Pulse Rate Statistics:   Pulse Mean (bpm):    84/min    Pulse Range (64-112/min)   IMPRESSION: Primary snoring   RECOMMENDATION:  This home sleep test does not demonstrate any significant obstructive or central sleep disordered breathing with a total AHI of less than 5/hour. Her total AHI was borderline at 4.5/hour, O2 nadir of 86% without any significant time below or at 88% saturation for the night (less than 1 minute).  Snoring was detected, in the mild to moderate range. Treatment with a positive airway pressure device such as AutoPap or CPAP is not indicated based on this test. Snoring may improve with avoidance of the supine sleep position and weight loss (where clinically appropriate).   For disturbing snoring, an oral appliance through dentistry or orthodontics can be considered.  Other causes of  the patient's symptoms, including circadian rhythm disturbances, an underlying mood disorder, medication effect and/or an underlying medical problem cannot be ruled out based on this test. Clinical correlation is recommended.  The patient should be cautioned not to drive, work at heights, or operate dangerous or heavy equipment when tired or sleepy. Review and reiteration of good sleep hygiene measures should be pursued with any patient. The patient will be advised to follow up with her referring provider, who will be notified of the test results.   I certify that I have reviewed the raw data recording prior to the issuance of this report in accordance with the standards of the American Academy of Sleep Medicine (AASM).    INTERPRETING PHYSICIAN:   True Mar, MD, PhD Medical Director, [facility]) Diplomat, ABPN (Neurology and Sleep)   [facility] [address], [address] [place], [place] [address] [phone]                    "

## 2024-06-14 ENCOUNTER — Telehealth: Payer: Self-pay | Admitting: Neurology

## 2024-06-14 NOTE — Telephone Encounter (Signed)
 Referral for dentistry fax to [facility]. Phone: [phone], Fax: [phone]

## 2024-06-14 NOTE — Telephone Encounter (Signed)
Referral to dentistry placed. 

## 2024-06-17 NOTE — Therapy (Signed)
 " OUTPATIENT OCCUPATIONAL THERAPY ORTHO EVALUATION  Patient Name: Tara Branch MRN: [id number] DOB:25-Jun-1980, 43 y.o., female Today's Date: 06/20/2024  PCP: Elicia Sharper, DO REFERRING PROVIDER: Arlinda Buster, MD  END OF SESSION:  OT End of Session - 06/20/24 1703     Visit Number 1    Number of Visits 6    Date for Recertification  09/18/24    Authorization Type [place] [place] [facility] [employer]-auth required    OT Start Time 1539   pt was late   OT Stop Time 1618    OT Time Calculation (min) 39 min    Activity Tolerance Patient tolerated treatment well    Behavior During Therapy [facility] for tasks assessed/performed          Past Medical History:  Diagnosis Date   Anxiety    Arthritis    Asthma    CTS (carpal tunnel syndrome) 10/2013   left   Headache    Irregular periods    LMP 05/2013 - has had BTL   Seasonal allergies    Sleep apnea    Past Surgical History:  Procedure Laterality Date   CARPAL TUNNEL RELEASE Right 07/11/2013   Procedure: RIGHT CARPAL TUNNEL RELEASE;  Surgeon: Franky JONELLE Curia, MD;  Location: [facility];  Service: Orthopedics;  Laterality: Right;   CARPAL TUNNEL RELEASE Left 10/28/2013   Procedure: LEFT CARPAL TUNNEL RELEASE;  Surgeon: Franky JONELLE Curia, MD;  Location: [facility];  Service: Orthopedics;  Laterality: Left;   CESAREAN SECTION  12/12/2003   TRIGGER FINGER RELEASE Right 06/03/2024   Procedure: RELEASE, A1 PULLEY, FOR TRIGGER FINGER;  Surgeon: Arlinda Buster, MD;  Location: [facility];  Service: Orthopedics;  Laterality: Right;  RIGHT THUMB TRIGGER DIGIT RELEASE   TUBAL LIGATION  05/10/2006   UMBILICAL HERNIA REPAIR N/A 10/23/2021   Procedure: OPEN UMBILICAL HERNIA REPAIR WITH MESH;  Surgeon: Vernetta Berg, MD;  Location: [facility];  Service: General;  Laterality: N/A;  LMA   WISDOM TOOTH EXTRACTION     Patient Active Problem List   Diagnosis Date Noted   S/P trigger finger release  06/20/2024   Epistaxis 04/05/2024   Healthcare maintenance 03/15/2024   Primary hypertension 06/29/2023   Trigger thumb, right thumb 05/14/2023   Uses oral contraception 02/17/2023   Prediabetes 02/04/2023   Morbid obesity with BMI of 40.0-44.9, adult (HCC) 11/19/2022   At risk for obstructive sleep apnea 11/19/2022   Difficulty sleeping 11/19/2022   Chronic low back pain 11/19/2022   Chronic migraine 09/15/2016   Dysfunctional uterine bleeding 05/01/2012   PCOS (polycystic ovarian syndrome) 05/01/2012    ONSET DATE: 05/17/2024 referral date, 06/03/24 DOS  REFERRING DIAG: M65.311 (ICD-10-CM) - Trigger thumb, right thumb  REFER TO IHP PGS [phone]  THERAPY DIAG:  Other lack of coordination  Muscle weakness (generalized)  Trigger finger of right thumb  Other symptoms and signs involving the musculoskeletal system  Status post trigger finger release  Rationale for Evaluation and Treatment: Rehabilitation  SUBJECTIVE:   SUBJECTIVE STATEMENT:  Pt accompanied by: self  PERTINENT HISTORY: 06/03/24 surgery for R trigger thumb release, anxiety, asthma, B CTR  PRECAUTIONS: None  RED FLAGS: None   WEIGHT BEARING RESTRICTIONS: No  PAIN:  Are you having pain? Yes: NPRS scale: 4/10 Pain location: R thumb Pain description: occasional throbbing, occasional sharp Aggravating factors: bending Relieving factors: was taking tylenol  with codeine   FALLS: Has patient fallen in last 6 months? 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  NEXT MD VISIT: 07/18/24 orthopedic  OBJECTIVE:  Note: Objective measures were completed at Evaluation unless otherwise  noted.  HAND DOMINANCE: Left, but does everything with her R hand  ADLs: WFL pt reports it's a little slower than usual but is able to complete independently  FUNCTIONAL OUTCOME MEASURES: Quick Dash: 54.5/100  UPPER EXTREMITY ROM:     Active ROM Right eval Left eval  Shoulder flexion    Shoulder abduction    Shoulder adduction    Shoulder extension    Shoulder internal rotation    Shoulder external rotation    Elbow flexion    Elbow extension    Wrist flexion    Wrist extension    Wrist ulnar deviation    Wrist radial deviation    Wrist pronation    Wrist supination    (Blank rows = not tested)  Active ROM Right eval Left eval  Thumb MCP (0-60) 30       Thumb Radial abd/add (0-55)     Thumb Palmar abd/add (0-45)     Thumb Opposition to Small Finger     Index MCP (0-90)     Index PIP (0-100)     Index DIP (0-70)      Long MCP (0-90)      Long PIP (0-100)      Long DIP (0-70)      Ring MCP (0-90)      Ring PIP (0-100)      Ring DIP (0-70)      Little MCP (0-90)      Little PIP (0-100)      Little DIP (0-70)      (Blank rows = not tested)   UPPER EXTREMITY MMT:     MMT Right eval Left eval  Shoulder flexion    Shoulder abduction    Shoulder adduction    Shoulder extension    Shoulder internal rotation    Shoulder external rotation    Middle trapezius    Lower trapezius    Elbow flexion    Elbow extension    Wrist flexion    Wrist extension    Wrist ulnar deviation    Wrist radial deviation    Wrist pronation    Wrist supination    (Blank rows = not tested)  HAND FUNCTION: Grip strength: Right: 26.1 avg lbs; Left: 45.9 avg lbs R: 23.3, 26.8, 28.2 L: 40.5, 44.5, 52.6 COORDINATION: 9 Hole Peg test: Right: 23.09 sec; Left: 21.07 sec Box and Blocks:  Right 59 blocks, Left 67 blocks  SENSATION: Pt reports some slight numbness and tingling in R thumb  EDEMA: slight swelling in R thumb   COGNITION: Overall cognitive status: Within  functional limits for tasks assessed Areas of impairment: WNL  OBSERVATIONS: impaired ROM, pain, impaired FM coordination and grip strength    TREATMENT DATE: 06/20/24  Educated pt in scar tissue massage but instructed to wait until scar area closed over before completing. Educated in heat and cold modality usage and edema reduction techniques. See Pt instructions for handout.     PATIENT EDUCATION: Education details: scar tissue massage, sleep positioning, cold/heat modality usage Person educated: Patient Education method: Explanation, Demonstration, and Handouts Education comprehension: verbalized understanding, returned demonstration, and needs further education  HOME EXERCISE PROGRAM:   GOALS: Goals reviewed with patient? Yes  SHORT TERM GOALS: Target date: 07/13/24  Patient will demonstrate independence with initial RUE HEP.  Baseline: New to OP OT Goal status: INITIAL  2.  Pt will independently recall at least 3 joint protection, ergonomics, and body mechanic principles as noted in pt instructions.   Baseline: New to OP OT Goal status: INITIAL  3.  Patient will independently recall at least 3 energy conservation principles in relation to ADLs to increase functional independence.  Baseline: New to OP OT Goal status: INITIAL  4.  Patient will demo improved FM coordination as evidenced by completing nine-hole peg with use of RUE in 21 seconds or less.  Baseline: Right: 23.09 sec; Left: 21.07 sec Goal status: INITIAL  5.  Pt will independently complete scar tissue massage  Baseline: Initiated education this date Goal status: INITIAL  6.  Pt will independently complete edema reduction techniques Baseline: Educated at eval Goal status: INITIAL  LONG TERM GOALS: Target date: 08/05/24  Patient will demonstrate at least 16% improvement with quick  Dash score (reporting 38.5% disability or less) indicating improved functional use of affected extremity.  Baseline: Quick Dash: 54.5/100 Goal status: INITIAL  2.  Pt will be able to place at least 65 blocks using right hand with completion of Box and Blocks test.  Baseline: Right 59 blocks, Left 67 blocks Goal status: INITIAL  3.  Patient will demonstrate at least 40 lbs R grip strength as needed to open jars and other containers.  Baseline: Right: 26.1 avg lbs; Left: 45.9 avg lbs Goal status: INITIAL  4.  Pt will demonstrate improved ROM in R thumb IP to at least 70 degrees Baseline:  Thumb IP (0-80) 45   Goal status: INITIAL  5.  Pt will demonstrate improved ROM in R thumb MCP to at least 50 degrees  Baseline:  Thumb MCP (0-60) 30   Goal status: INITIAL  6.  Pt will report decreased pain during functional tasks to no more than 2/10 Baseline: 4/10, can increase at times during functional tasks. Goal status: INITIAL  ASSESSMENT:  CLINICAL IMPRESSION: Patient is a 43 y.o. female who was seen today for occupational therapy evaluation for s/p R trigger thumb release on 06/03/24. Hx includes CTR, anxiety, asthma. Patient currently presents below baseline level of functioning demonstrating functional deficits and impairments as noted below. Pt would benefit from skilled OT services in the outpatient setting to work on impairments as noted below to help pt return to PLOF as able.     PERFORMANCE DEFICITS: in functional skills including IADLs, coordination, dexterity, sensation, edema, ROM, strength, pain, decreased knowledge of precautions, decreased knowledge of use of DME, and UE functional use,and psychosocial skills including coping strategies, environmental adaptation, and routines and behaviors.   IMPAIRMENTS: are limiting patient from IADLs, rest and sleep, work, leisure, and social participation.   COMORBIDITIES: may have co-morbidities  that affects occupational  performance. Patient will benefit from skilled OT to address above impairments and improve overall function.  MODIFICATION OR ASSISTANCE TO COMPLETE EVALUATION: No modification of  tasks or assist necessary to complete an evaluation.  OT OCCUPATIONAL PROFILE AND HISTORY: Problem focused assessment: Including review of records relating to presenting problem.  CLINICAL DECISION MAKING: LOW - limited treatment options, no task modification necessary  REHAB POTENTIAL: Good  EVALUATION COMPLEXITY: Low      PLAN:  OT FREQUENCY: 1x/week plus eval  OT DURATION: 6 weeks  PLANNED INTERVENTIONS: 97168 OT Re-evaluation, 97535 self care/ADL training, 02889 therapeutic exercise, 97530 therapeutic activity, 97140 manual therapy, 97035 ultrasound, 97018 paraffin, 02960 fluidotherapy, 97010 moist heat, 97010 cryotherapy, 97034 contrast bath, scar mobilization, passive range of motion, psychosocial skills training, energy conservation, coping strategies training, patient/family education, and DME and/or AE instructions  RECOMMENDED OTHER SERVICES: none  CONSULTED AND AGREED WITH PLAN OF CARE: Patient  PLAN FOR NEXT SESSION:    For all possible CPT codes, reference the Planned Interventions line above.     Check all conditions that are expected to impact treatment: {Conditions expected to impact treatment:Musculoskeletal disorders and Social determinants of health   If treatment provided at initial evaluation, no treatment charged due to lack of authorization.       Rocky Dutch, OT 06/20/2024, 5:09 PM   "

## 2024-06-20 ENCOUNTER — Ambulatory Visit: Admitting: Orthopedic Surgery

## 2024-06-20 ENCOUNTER — Other Ambulatory Visit: Payer: Self-pay

## 2024-06-20 ENCOUNTER — Ambulatory Visit

## 2024-06-20 DIAGNOSIS — M65311 Trigger thumb, right thumb: Secondary | ICD-10-CM | POA: Diagnosis present

## 2024-06-20 DIAGNOSIS — R29898 Other symptoms and signs involving the musculoskeletal system: Secondary | ICD-10-CM | POA: Insufficient documentation

## 2024-06-20 DIAGNOSIS — R278 Other lack of coordination: Secondary | ICD-10-CM | POA: Diagnosis present

## 2024-06-20 DIAGNOSIS — Z9889 Other specified postprocedural states: Secondary | ICD-10-CM | POA: Insufficient documentation

## 2024-06-20 DIAGNOSIS — M6281 Muscle weakness (generalized): Secondary | ICD-10-CM | POA: Diagnosis present

## 2024-06-20 NOTE — Progress Notes (Unsigned)
" ° °  Tara Branch - 43 y.o. female MRN [id number]  Date of birth: 1980-09-05  Office Visit Note: Visit Date: 06/20/2024 PCP: Elicia Sharper, DO Referred by: Elicia Sharper, DO  Subjective:  HPI: Tara Branch is a 43 y.o. female who presents today for follow up 2 weeks status post right thumb trigger digit release. Doing well overall today, pain is well controlled.  Pertinent ROS were reviewed with the patient and found to be negative unless otherwise specified above in HPI.   Assessment & Plan: Visit Diagnoses:  1. Trigger thumb, right thumb   2. S/P trigger finger release     Plan: Sutures were removed today. She is having minimal pain at the site of incision but overall well. She will see occupational therapy for ROM and possible HEP. She will follow up with me in 4 weeks.  Follow-up: No follow-ups on file.   Meds & Orders: No orders of the defined types were placed in this encounter.  No orders of the defined types were placed in this encounter.    Procedures: No procedures performed       Objective:   Vital Signs: There were no vitals taken for this visit.  Ortho Exam Right hand: - Well-healing incision at the base of the thumb, skin is well-approximated, no erythema or drainage - Able to perform full digital range of motion without residual clicking or locking - Sensation intact distally, hand is warm well-perfused   Imaging: No results found.   Joesph Dinsmore, OPA-C  "

## 2024-06-20 NOTE — Patient Instructions (Addendum)
 How to Use Cold Therapy Cold therapy uses cold temperatures to treat an injury or other problem. You can use cold packs or ice packs to help with pain and swelling. Only use cold therapy if your doctor says it's OK. What are the risks? Your doctor will talk with you about risks. You may be told to avoid cold therapy if: You're not able to let people know when you're in pain. Young children and people who have a brain problem called dementia may have trouble with this. You have certain conditions, such as: Raynaud's syndrome. This is a problem with your blood vessels. It slows blood flow to your fingers and toes. Feeling very cold easily. Lack of feeling in the area being iced. Do not use cold therapy unless your doctor says it's OK if you have: A heart condition. High blood pressure. Open or healing wounds. An infection. Rheumatoid arthritis. This is pain and swelling in your joints. Poor blood flow. Diabetes. Certain skin problems. How do I make a cold pack? There are a few things you can use to make a cold pack at home. These include: A silica gel cold pack that's been left in the freezer. You can buy this online or in stores. A sealable plastic bag filled with crushed ice. A washcloth or paper towels soaked in cold water or ice water. A plastic bag of frozen vegetables. Throw the bag away when you're done using it as a cold pack. Supplies needed: A cold pack. A towel. This can be dry or damp. How to use cold therapy  Have your cold pack ready. Place a towel between the cold pack and your skin. Or, you can wrap the cold pack in a towel. Use the cold pack. Leave it on for no more than 20 minutes at a time. Check your skin after 5 minutes for any problems. Check for: White spots on your skin. Your skin may look blotchy or mottled. Skin that looks blue or pale. Skin that feels waxy or hard. Do these steps as many times each day as told. If your skin turns red, take off the cold  pack right away to prevent skin damage. The risk of damage is higher if you can't feel pain, heat, or cold. Be sure to always use a towel. Do not put the cold pack right on your skin. Contact a doctor if: You start to have white spots on your skin. Your skin turns blue or pale. Your skin gets waxy or hard. Your swelling gets worse. This information is not intended to replace advice given to you by your health care provider. Make sure you discuss any questions you have with your health care provider. Document Revised: 01/14/2023 Document Reviewed: 01/14/2023 Elsevier Patient Education  2024 Elsevier Inc.    How to Use Heat Therapy Heat therapy is the use of heat to relax your muscles. This can help with pain and muscle spasms. Using heat can help if your muscles or joints are: Sore. Stiff. Injured. Tight. What are the risks? Unless your doctor says it's OK, do not use heat therapy if you have any of these things: New bruises. Open or healing wounds. Skin problems, such as: Infected skin. Scars in the spot being treated. Blood problems, such as: Bleeding. Poor blood flow. Blood clots. Loss of feeling in the spot being treated. Swelling that's not normal. Conditions, such as: Diabetes. Heart disease. Cancer. Trouble letting people know when you're in pain. Young children and people who have a  brain problem called dementia may have trouble with this. How to use heat therapy Use the heat source that your doctor recommends, such as: A moist heat pack. A hot water bottle. This should be warm but not too hot. An electric heating pad. A heated gel pack. A heated wrap. A warm water bath. Use heat as told. In most cases, you should: Place a towel between your skin and the heat source. Leave the heat on for 20-30 minutes. Your skin may turn pink. If your skin turns red, take off the heat right away to prevent burns. The risk of burns is higher if you can't feel pain, heat, or  cold. If told, you can also take a warm water bath. To do so: Put a non-slip pad in the bathtub to prevent a fall. Fill the bathtub with warm water. Check the water temperature. Soak in the water for 15-20 minutes, or for as long as you're told. Be careful when you stand up after the bath. You may feel dizzy. Pat yourself dry after the bath. Do not rub your skin to dry it. General recommendations for heat therapy Be careful to avoid burns. You can get burns from: High heat. Keeping the heat on your skin for too long. Do not sleep while using heat therapy. Only use heat therapy when you're awake. Check your skin during heat therapy. Do not use heat therapy: For new injuries. On skin that's irritated. This may be from a rash or sunburn. If your skin is red or turns red. Contact a doctor if: You have any of these things in the spot on your body where you use heat therapy: Blisters. Redness. Swelling. Loss of feeling. You have new pain. Your pain gets worse. This information is not intended to replace advice given to you by your health care provider. Make sure you discuss any questions you have with your health care provider. Document Revised: 01/14/2023 Document Reviewed: 01/14/2023 Elsevier Patient Education  2024 Elsevier Inc.      Swelling Reduction  Place affected area above the level of your heart. You can use pillows for support. Move the affected area. Think about tightening the muscles in this area and then relaxing them.  Pet the area from the outside of your body toward the center of your body. If it is your hand, from the tips of your fingers to the shoulder or chest.

## 2024-06-27 ENCOUNTER — Institutional Professional Consult (permissible substitution) (INDEPENDENT_AMBULATORY_CARE_PROVIDER_SITE_OTHER)

## 2024-07-04 ENCOUNTER — Ambulatory Visit: Payer: Self-pay

## 2024-07-04 VITALS — BP 138/89 | HR 86 | Temp 98.4°F | Ht 64.0 in | Wt 238.6 lb

## 2024-07-04 DIAGNOSIS — G8929 Other chronic pain: Secondary | ICD-10-CM | POA: Diagnosis not present

## 2024-07-04 DIAGNOSIS — M545 Low back pain, unspecified: Secondary | ICD-10-CM

## 2024-07-04 DIAGNOSIS — Z6841 Body Mass Index (BMI) 40.0 and over, adult: Secondary | ICD-10-CM

## 2024-07-04 DIAGNOSIS — N938 Other specified abnormal uterine and vaginal bleeding: Secondary | ICD-10-CM

## 2024-07-04 NOTE — Progress Notes (Unsigned)
 "  Established Patient Office Visit  Subjective   Patient ID: Tara Branch, female    DOB: 02-Mar-1981  Age: 44 y.o. MRN: [id number]  No chief complaint on file.   HPI Patient is a 44 year old female with PMH of chronic low back pain, morbid obesity, PCOS, Prediabetes, hypertension  that presents today for follow up of chronic conditions. See problem based assessments for more details    ROS See problem based assessment for more details.    Objective:     BP 138/89 (BP Location: Right Arm, Patient Position: Sitting, Cuff Size: Large)   Pulse 86   Temp 98.4 F (36.9 C) (Oral)   Ht 5' 4 (1.626 m)   Wt 238 lb 9.6 oz (108.2 kg)   SpO2 96%   BMI 40.96 kg/m  BP Readings from Last 3 Encounters:  07/04/24 138/89  06/03/24 131/82  05/04/24 (!) 143/98   Wt Readings from Last 3 Encounters:  07/04/24 238 lb 9.6 oz (108.2 kg)  06/03/24 233 lb 14.5 oz (106.1 kg)  05/04/24 230 lb 6.4 oz (104.5 kg)      Physical Exam Constitutional:      General: She is not in acute distress. Cardiovascular:     Rate and Rhythm: Normal rate and regular rhythm.     Heart sounds: No murmur heard. Pulmonary:     Effort: Pulmonary effort is normal. No respiratory distress.     Breath sounds: Normal breath sounds.  Musculoskeletal:     Comments: No midline spinal tenderness 5/5 strength in bilateral lower extremities Straight less raise negative bilaterally  Sensation intact bilaterally   Neurological:     Mental Status: She is alert.     No results found for any visits on 07/04/24.    The 10-year ASCVD risk score (Arnett DK, et al., 2019) is: 1.8%    Assessment & Plan:   Problem List Items Addressed This Visit       Genitourinary   Dysfunctional uterine bleeding   Was discussed at a previous visit. Patient will not be able to get into obgyn until April. Did give precautions to go to the ED if worsening bleeding with lightheadedness and dizziness. Patient is also due for pap  smear, which we discussed could be done at the next visit. Patient states that starting on birth control has helped her bleeding some but still having irregular periods. Patient also reports night sweats but no weight loss. No family history of uterine or vaginal cancer, but does have history of breast cancer in sister.  Denies any chance of being pregnant and declines pregnancy test at this time.   Plan: Pap smear at next visit         Other   Morbid obesity with BMI of 40.0-44.9, adult (HCC) (Chronic)   Patient was previously on wegovy  but has not had injection in over a month due to change in insurance coverage of the medication. Did discuss options such as referral to nutrition clinics and other medications, but patient would prefer to try on her own if wegovy  isn't covered.   Plan: Will attempt ordering wegovy , and see if covered.  Start at lowest dose for 4 weeks and up titrate       Relevant Medications   semaglutide -weight management (WEGOVY ) 0.25 MG/0.5ML SOAJ SQ injection   Chronic low back pain - Primary   Mild disc bulge at L4-L5 and L5-SI noted on MRI from 2024. Patient states that pain comes and goes. Has  been seeing neurosurgeon now for management. Had previous history of steroid injections in the back and is now getting nerve blocks to help manage.Patient has tried muscle relaxers and lidocaine  patches.  Pain is intermittent. Patient's strength and sensation is intact in bilateral lower extremities.No midline or paraspinal tenderness.Exam reassuring. 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  Previously filled out by another provider. Did discuss that patient could benefit from  physical therapy. Emphasized the importance of continued movement and weight loss strategies, possibly with wegovy . Patient will ask neurosurgery about handicap placard, and if not will contact office.   Plan:  Physical therapy referral       Relevant Orders   Ambulatory  referral to Physical Therapy    Return in about 4 weeks (around 08/01/2024) for for pap smear .    Modesty Rudy D'Mello, DO Patient discussed with Dr. Lovie  "

## 2024-07-04 NOTE — Patient Instructions (Signed)
 Today we discussed the following medical conditions and plan:   For your back, I think seeing the physical therapist will help  For weight loss, we will try to see if wegovy  is covered again. Start out at the lowest dose if we are able to get it and take it for four weeks  For your night sweats, I believe that they could be due to fluctuations in your hormones. If you could try to get into your Obgyn, I think that would be good. If not we can do your pap smear here at your next visit   We look forward to seeing you next time. Please call our clinic at [phone] if you have any questions or concerns. The best time to call is Monday-Friday from 9am-4pm, but there is someone available 24/7. If you need medication refills, please notify your pharmacy one week in advance and they will send us  a request.   Thank you for trusting me with your care. Wishing you the best!   Macoy Rodwell D'Mello, DO  [facility]

## 2024-07-05 ENCOUNTER — Ambulatory Visit
Admission: RE | Admit: 2024-07-05 | Discharge: 2024-07-05 | Disposition: A | Source: Ambulatory Visit | Attending: Internal Medicine

## 2024-07-05 ENCOUNTER — Other Ambulatory Visit (HOSPITAL_COMMUNITY): Payer: Self-pay

## 2024-07-05 DIAGNOSIS — Z1231 Encounter for screening mammogram for malignant neoplasm of breast: Secondary | ICD-10-CM

## 2024-07-05 MED ORDER — WEGOVY 0.25 MG/0.5ML ~~LOC~~ SOAJ: 0.2500 mg | SUBCUTANEOUS | 1 refills | Status: AC

## 2024-07-05 NOTE — Assessment & Plan Note (Signed)
 Was discussed at a previous visit. Patient will not be able to get into obgyn until April. Did give precautions to go to the ED if worsening bleeding with lightheadedness and dizziness. Patient is also due for pap smear, which we discussed could be done at the next visit. Patient states that starting on birth control has helped her bleeding some but still having irregular periods. Patient also reports night sweats but no weight loss. No family history of uterine or vaginal cancer, but does have history of breast cancer in sister.  Denies any chance of being pregnant and declines pregnancy test at this time.   Plan: Pap smear at next visit

## 2024-07-05 NOTE — Assessment & Plan Note (Signed)
 Mild disc bulge at L4-L5 and L5-SI noted on MRI from 2024. Patient states that pain comes and goes. Has been seeing neurosurgeon now for management. Had previous history of steroid injections in the back and is now getting nerve blocks to help manage.Patient has tried muscle relaxers and lidocaine  patches.  Pain is intermittent. Patient's strength and sensation is intact in bilateral lower extremities.No midline or paraspinal tenderness.Exam reassuring. 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  Previously filled out by another provider. Did discuss that patient could benefit from  physical therapy. Emphasized the importance of continued movement and weight loss strategies, possibly with wegovy . Patient will ask neurosurgery about handicap placard, and if not will contact office.   Plan:  Physical therapy referral

## 2024-07-05 NOTE — Assessment & Plan Note (Signed)
 Patient was previously on wegovy  but has not had injection in over a month due to change in insurance coverage of the medication. Did discuss options such as referral to nutrition clinics and other medications, but patient would prefer to try on her own if wegovy  isn't covered.   Plan: Will attempt ordering wegovy , and see if covered.  Start at lowest dose for 4 weeks and up titrate

## 2024-07-06 ENCOUNTER — Ambulatory Visit: Attending: Orthopedic Surgery | Admitting: Occupational Therapy

## 2024-07-06 NOTE — Progress Notes (Signed)
 Internal Medicine Clinic Attending  Case discussed with the resident at the time of the visit.  We reviewed the resident's history and exam and pertinent patient test results.  I agree with the assessment, diagnosis, and plan of care documented in the resident's note.

## 2024-07-07 ENCOUNTER — Ambulatory Visit: Payer: Self-pay | Admitting: Student

## 2024-07-12 ENCOUNTER — Telehealth: Payer: Self-pay

## 2024-07-12 ENCOUNTER — Ambulatory Visit

## 2024-07-12 NOTE — Telephone Encounter (Signed)
 Pt no-showed for OT appointment today. Therefore, OT called pt's listed phone number [phone]). OT left voicemail reminding pt of next OT appointment date/time, provided education on cancellation/no-show policy, recommended to pt to call clinic office if pt is unable to make appointments, and provided contact information of clinic.  Rocky Dutch, OTR/L

## 2024-07-18 ENCOUNTER — Ambulatory Visit: Admitting: Orthopedic Surgery

## 2024-07-19 ENCOUNTER — Telehealth: Payer: Self-pay

## 2024-07-19 ENCOUNTER — Ambulatory Visit

## 2024-07-19 NOTE — Telephone Encounter (Signed)
 Pt no-showed for OT appointment today. This is patient's second no show. Therefore, OT called pt's listed phone number [phone]). OT left voicemail reminding pt of next OT appointment date/time, provided education on cancellation/no-show policy, recommended to pt to call clinic office if pt is unable to make appointments, and provided contact information of clinic.  Rocky Dutch, OTR/L

## 2024-07-21 ENCOUNTER — Telehealth: Payer: Self-pay | Admitting: *Deleted

## 2024-07-21 NOTE — Telephone Encounter (Signed)
 Copied from CRM #[id number]. Topic: Clinical - Prescription Issue >> Jul 21, 2024  4:24 PM DeAngela L wrote: Reason for CRM: patient states [facility] sent a response to the office a month ago and was waiting for a reply back concerning the patient medication semaglutide -weight management (WEGOVY ) 0.25 MG/0.5ML SOAJ SQ injection  Patient num Mobile [phone]  [id number] - [place], [place] - [address] AT [facility] [address] [place] [address] Phone: [phone] Fax: [phone]

## 2024-07-21 NOTE — Telephone Encounter (Signed)
"    04/12/24 10:16 AM Note Date: 04/12/2024 Request #: [id number] Physician name: Ozell Nearing Office fax: [phone] Member name: Tara Branch Member ID: [id number] Member DOB: March 15, 1981 Drug requested: WEGOVY  1.7 MG/0.75 ML PEN Notes: This is a courtesy notification to advise you that we do not cover the above-requested  medication under this members benefit plan      I called pt who stated she has not changed insurance since the PA was done 03/2024. Pt stated she talked to Dr D.Mello about this med at [facility]. I will ask Kari to f/u and sending to the doctor. "

## 2024-07-22 NOTE — Telephone Encounter (Signed)
 Her insurance does not cover the medication.

## 2024-07-26 ENCOUNTER — Ambulatory Visit

## 2024-07-26 NOTE — Therapy (Unsigned)
 [facility] [address] [place], [place], [address] Phone: [phone]   Fax:  [phone]  Patient Details  Name: Tara Branch MRN: [id number] Date of Birth: November 28, 1980 Referring Provider:  No ref. provider found  Encounter Date: 07/26/2024 OCCUPATIONAL THERAPY DISCHARGE SUMMARY  Visits from Start of Care: Pt received 06/20/24 eval only.  Current functional level related to goals / functional outcomes: No goals met d/t pt not returning since eval.    Remaining deficits: PERFORMANCE DEFICITS: in functional skills including IADLs, coordination, dexterity, sensation, edema, ROM, strength, pain, decreased knowledge of precautions, decreased knowledge of use of DME, and UE functional use,and psychosocial skills including coping strategies, environmental adaptation, and routines and behaviors.    Education / Equipment: Educated in purpose of OT, scar tissue massage once scar closed over heat and cold modalities for pain, edema reduction techniques   Patient agrees to discharge. Patient goals were unable to assess d/t not returning since last visit. Patient is being discharged due to not returning since the last visit.SABRA Rocky Dutch, OT 07/26/2024, 1:35 PM  [facility] [address] [place], [place], [address] Phone: [phone]   Fax:  [phone]

## 2024-07-29 ENCOUNTER — Institutional Professional Consult (permissible substitution) (INDEPENDENT_AMBULATORY_CARE_PROVIDER_SITE_OTHER)

## 2024-08-02 ENCOUNTER — Ambulatory Visit

## 2024-08-03 ENCOUNTER — Ambulatory Visit: Payer: Self-pay

## 2024-08-09 ENCOUNTER — Ambulatory Visit

## 2024-08-15 ENCOUNTER — Ambulatory Visit: Admitting: Orthopedic Surgery

## 2024-08-29 ENCOUNTER — Ambulatory Visit: Admitting: Student
